# Patient Record
Sex: Female | Born: 1985 | Race: Black or African American | Marital: Single | Smoking: Never smoker
Health system: Northeastern US, Academic
[De-identification: ages and names within clinical notes are randomized; demographics above are authoritative.]

## PROBLEM LIST (undated history)

## (undated) DIAGNOSIS — N760 Acute vaginitis: Secondary | ICD-10-CM

## (undated) DIAGNOSIS — E669 Obesity, unspecified: Secondary | ICD-10-CM

## (undated) DIAGNOSIS — F53 Postpartum depression: Secondary | ICD-10-CM

## (undated) DIAGNOSIS — Z87440 Personal history of urinary (tract) infections: Secondary | ICD-10-CM

## (undated) DIAGNOSIS — B3731 Acute candidiasis of vulva and vagina: Secondary | ICD-10-CM

## (undated) DIAGNOSIS — E282 Polycystic ovarian syndrome: Secondary | ICD-10-CM

## (undated) DIAGNOSIS — I10 Essential (primary) hypertension: Secondary | ICD-10-CM

## (undated) HISTORY — DX: Personal history of urinary (tract) infections: Z87.440

## (undated) HISTORY — DX: Obesity, unspecified: E66.9

## (undated) HISTORY — DX: Acute candidiasis of vulva and vagina: B37.31

## (undated) HISTORY — DX: Acute vaginitis: N76.0

## (undated) HISTORY — DX: Postpartum depression: F53.0

## (undated) HISTORY — PX: MASTECTOMY, PARTIAL: SHX709

## (undated) HISTORY — PX: KNEE SURGERY: SHX244

## (undated) HISTORY — PX: APPENDECTOMY: SHX54

## (undated) HISTORY — PX: CYST EXCISION: SHX5701

---
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
## 2007-03-16 ENCOUNTER — Ambulatory Visit (HOSPITAL_COMMUNITY): Admission: RE | Admit: 2007-03-16 | Discharge: 2007-03-16 | Payer: Self-pay | Admitting: Family Medicine

## 2007-03-18 ENCOUNTER — Encounter: Payer: Self-pay | Admitting: Cardiology

## 2009-04-13 ENCOUNTER — Ambulatory Visit
Admit: 2009-04-13 | Discharge: 2009-04-13 | Disposition: A | Discharge status: Home / Self Care | Payer: Self-pay | Source: Ambulatory Visit | Attending: Obstetrics and Gynecology | Admitting: Obstetrics and Gynecology

## 2009-04-20 ENCOUNTER — Ambulatory Visit
Admit: 2009-04-20 | Discharge: 2009-04-20 | Disposition: A | Discharge status: Home / Self Care | Payer: Self-pay | Source: Ambulatory Visit | Admitting: Obstetrics and Gynecology

## 2009-04-20 ENCOUNTER — Ambulatory Visit
Admit: 2009-04-20 | Discharge: 2009-04-20 | Disposition: A | Discharge status: Home / Self Care | Payer: Self-pay | Source: Ambulatory Visit | Attending: Obstetrics and Gynecology | Admitting: Obstetrics and Gynecology

## 2009-04-27 ENCOUNTER — Ambulatory Visit: Payer: Self-pay | Admitting: Obstetrics and Gynecology

## 2009-05-04 ENCOUNTER — Ambulatory Visit: Payer: Self-pay | Admitting: Obstetrics & Gynecology

## 2009-05-08 ENCOUNTER — Observation Stay
Admit: 2009-05-08 | Disposition: A | Discharge status: Home / Self Care | Payer: Self-pay | Source: Ambulatory Visit | Attending: Family Medicine | Admitting: Family Medicine

## 2009-05-08 ENCOUNTER — Encounter: Payer: Self-pay | Admitting: Family Medicine

## 2009-05-11 ENCOUNTER — Ambulatory Visit: Payer: Self-pay | Admitting: Obstetrics and Gynecology

## 2009-05-12 ENCOUNTER — Ambulatory Visit
Admit: 2009-05-12 | Discharge: 2009-05-12 | Disposition: A | Discharge status: Home / Self Care | Payer: Self-pay | Source: Ambulatory Visit | Admitting: Obstetrics and Gynecology

## 2009-05-12 ENCOUNTER — Observation Stay
Admit: 2009-05-12 | Disposition: A | Discharge status: Home / Self Care | Payer: Self-pay | Source: Ambulatory Visit | Attending: Obstetrics and Gynecology | Admitting: Obstetrics and Gynecology

## 2009-05-12 ENCOUNTER — Inpatient Hospital Stay: Admit: 2009-05-12 | Discharge: 2009-05-12 | Disposition: A | Discharge status: Home / Self Care | Payer: Self-pay

## 2009-05-15 ENCOUNTER — Inpatient Hospital Stay
Admit: 2009-05-15 | Disposition: A | Discharge status: Home / Self Care | Payer: Self-pay | Source: Ambulatory Visit | Attending: Obstetrics and Gynecology | Admitting: Obstetrics and Gynecology

## 2009-05-15 LAB — CBC AND DIFFERENTIAL
Basophil %: 0 % (ref 0.1–1.2)
Eosinophil %: 2 % (ref 0.7–5.8)
Hematocrit: 33 % — ABNORMAL LOW (ref 34–45)
Hemoglobin: 9.9 g/dL — ABNORMAL LOW (ref 11.2–15.7)
Lymphocyte %: 24 % (ref 19.3–51.7)
MCV: 81 fL (ref 79–95)
Monocyte %: 7 % (ref 4.7–12.5)
Platelets: 115 THOU/uL — ABNORMAL LOW (ref 160–370)
RBC: 4 MIL/uL (ref 3.9–5.2)
RDW: 15.2 % — ABNORMAL HIGH (ref 11.7–14.4)
Seg Neut %: 67 % (ref 34.0–71.1)
WBC: 7.2 THOU/uL (ref 4.0–10.0)

## 2009-05-15 LAB — TYPE AND SCREEN
ABO RH Blood Type: B POS
Antibody Screen: NEGATIVE

## 2009-05-17 LAB — RPR: RPR Screen: NONREACTIVE

## 2009-05-18 LAB — COMPREHENSIVE METABOLIC PANEL
ALT: 5 U/L (ref 0–35)
ALT: 6 U/L (ref 0–35)
AST: 20 U/L (ref 0–35)
AST: 25 U/L (ref 0–35)
Albumin: 3.1 g/dL — ABNORMAL LOW (ref 3.5–5.2)
Albumin: 2.9 g/dL — ABNORMAL LOW (ref 3.5–5.2)
Alk Phos: 276 U/L — ABNORMAL HIGH (ref 35–105)
Alk Phos: 246 U/L — ABNORMAL HIGH (ref 35–105)
Anion Gap: 11 (ref 7–16)
Anion Gap: 13 (ref 7–16)
Bilirubin,Total: 0.5 mg/dL (ref 0.0–1.2)
Bilirubin,Total: 0.4 mg/dL (ref 0.0–1.2)
CO2: 22 mmol/L (ref 20–28)
CO2: 20 mmol/L (ref 20–28)
Calcium: 8.4 mg/dL — ABNORMAL LOW (ref 9.0–10.4)
Calcium: 8.7 mg/dL — ABNORMAL LOW (ref 9.0–10.4)
Chloride: 101 mmol/L (ref 96–108)
Chloride: 100 mmol/L (ref 96–108)
Creatinine: 1.24 mg/dL — ABNORMAL HIGH (ref 0.51–0.95)
Creatinine: 1.1 mg/dL — ABNORMAL HIGH (ref 0.51–0.95)
GFR,Black: >59 *
GFR,Black: >59 *
GFR,Caucasian: 53 * — AB
GFR,Caucasian: >59 *
Globulin: 2.4 g/dL — ABNORMAL LOW (ref 2.7–4.3)
Globulin: 2.2 g/dL — ABNORMAL LOW (ref 2.7–4.3)
Glucose: 66 mg/dL — ABNORMAL LOW (ref 74–106)
Glucose: 100 mg/dL (ref 74–106)
Lab: 9 mg/dL (ref 6–20)
Lab: 9 mg/dL (ref 6–20)
Potassium: 4.3 mmol/L (ref 3.3–5.1)
Potassium: 3.8 mmol/L (ref 3.3–5.1)
Sodium: 134 mmol/L (ref 133–145)
Sodium: 133 mmol/L (ref 133–145)
Total Protein: 5.5 g/dL — ABNORMAL LOW (ref 6.3–7.7)
Total Protein: 5.1 g/dL — ABNORMAL LOW (ref 6.3–7.7)

## 2009-05-18 LAB — CBC AND DIFFERENTIAL
Baso # K/uL: 0 THOU/uL (ref 0.0–0.1)
Basophil %: 0.1 % (ref 0.1–1.2)
Eos # K/uL: 0 THOU/uL (ref 0.0–0.4)
Eosinophil %: 0.1 % — ABNORMAL LOW (ref 0.7–5.8)
Hematocrit: 31 % — ABNORMAL LOW (ref 34–45)
Hemoglobin: 9.3 g/dL — ABNORMAL LOW (ref 11.2–15.7)
Lymph # K/uL: 1 THOU/uL — ABNORMAL LOW (ref 1.2–3.7)
Lymphocyte %: 4.6 % — ABNORMAL LOW (ref 19.3–51.7)
MCV: 81 fL (ref 79–95)
Mono # K/uL: 1.2 THOU/uL — ABNORMAL HIGH (ref 0.2–0.9)
Monocyte %: 5.7 % (ref 4.7–12.5)
Neut # K/uL: 19.6 THOU/uL — ABNORMAL HIGH (ref 1.6–6.1)
Platelets: 115 THOU/uL — ABNORMAL LOW (ref 160–370)
RBC: 3.8 MIL/uL — ABNORMAL LOW (ref 3.9–5.2)
RDW: 15.9 % — ABNORMAL HIGH (ref 11.7–14.4)
Seg Neut %: 89.1 % — ABNORMAL HIGH (ref 34.0–71.1)
WBC: 21.9 THOU/uL — ABNORMAL HIGH (ref 4.0–10.0)

## 2009-05-18 LAB — PROTEIN, URINE: Protein,UR: 240 mg/dL

## 2009-05-18 LAB — URIC ACID
Urate: 5.5 mg/dL (ref 2.7–6.8)
Urate: 6.1 mg/dL (ref 2.7–6.8)

## 2009-05-18 LAB — LACTATE DEHYDROGENASE: LD: 225 U/L (ref 118–225)

## 2009-05-18 LAB — CREATININE, URINE: Creatinine,UR: 131 mg/dL

## 2009-05-19 ENCOUNTER — Ambulatory Visit
Admit: 2009-05-19 | Discharge: 2009-05-19 | Disposition: A | Discharge status: Home / Self Care | Payer: Self-pay | Source: Ambulatory Visit | Admitting: Primary Care

## 2009-07-01 ENCOUNTER — Ambulatory Visit: Payer: Self-pay | Admitting: Obstetrics and Gynecology

## 2009-07-27 ENCOUNTER — Ambulatory Visit: Payer: Self-pay | Admitting: Obstetrics and Gynecology

## 2009-07-28 DIAGNOSIS — Z202 Contact with and (suspected) exposure to infections with a predominantly sexual mode of transmission: Secondary | ICD-10-CM

## 2009-07-28 HISTORY — DX: Contact with and (suspected) exposure to infections with a predominantly sexual mode of transmission: Z20.2

## 2009-08-14 ENCOUNTER — Ambulatory Visit: Payer: Self-pay | Admitting: Obstetrics and Gynecology

## 2009-08-14 ENCOUNTER — Ambulatory Visit
Admit: 2009-08-14 | Discharge: 2009-08-14 | Disposition: A | Discharge status: Home / Self Care | Payer: Self-pay | Source: Ambulatory Visit | Attending: Obstetrics and Gynecology | Admitting: Obstetrics and Gynecology

## 2009-08-15 LAB — VAGINITIS SCREEN: DNA PROBE: Vaginitis Screen:DNA Probe: DETECTED

## 2009-08-17 LAB — N. GONORRHOEAE DNA AMPLIFICATION: N. gonorrhoeae DNA Amplification: NOT DETECTED

## 2009-08-17 LAB — CHLAMYDIA PLASMID DNA AMPLIFICATION: Chlamydia Plasmid DNA Amplification: DETECTED

## 2009-09-08 ENCOUNTER — Ambulatory Visit: Payer: Self-pay | Admitting: Obstetrics & Gynecology

## 2009-10-31 ENCOUNTER — Emergency Department
Admit: 2009-10-31 | Disposition: A | Discharge status: Home / Self Care | Payer: Self-pay | Source: Ambulatory Visit | Attending: Emergency Medicine | Admitting: Emergency Medicine

## 2009-11-04 ENCOUNTER — Ambulatory Visit: Payer: Self-pay

## 2010-01-27 ENCOUNTER — Ambulatory Visit: Payer: Self-pay

## 2010-04-13 ENCOUNTER — Emergency Department: Admit: 2010-04-13 | Disposition: A | Discharge status: Home / Self Care | Payer: Self-pay | Source: Ambulatory Visit

## 2010-04-13 LAB — RAPID STREP SCREEN: Rapid Strep Group A Throat: 0

## 2010-04-13 MED ORDER — AZITHROMYCIN 250 MG PO TABS *I*
ORAL_TABLET | ORAL | Status: AC
Start: 2010-04-13 — End: 2010-04-18

## 2010-04-13 MED ORDER — IBUPROFEN 200 MG PO TABS *I*
600.0000 mg | ORAL_TABLET | Freq: Once | ORAL | Status: AC
Start: 2010-04-13 — End: 2010-04-13
  Administered 2010-04-13: 600 mg via ORAL
  Filled 2010-04-13: qty 3

## 2010-04-13 NOTE — ED Notes (Signed)
 Pt c/o 3 days throat pain and eye redness with discharge since this morning.  Throat swab obtained in triage

## 2010-04-13 NOTE — Discharge Instructions (Signed)
Use warm compresses to left eye four times a day for viral conjunctivitis.     Take antibiotics in 1-2 days with increased symptoms.   Most likely this is viral. We will call you if your culture is positive for strep.     Drink plenty of fluids and rest.    Take Ibuprofen as needed and directed for pain. Take with food.

## 2010-04-14 ENCOUNTER — Ambulatory Visit: Payer: Self-pay

## 2010-04-14 ENCOUNTER — Encounter: Payer: Self-pay | Admitting: Geriatric Medicine

## 2010-04-14 NOTE — ED Provider Notes (Signed)
 History   Chief Complaint   Patient presents with   . Pharyngitis       HPI Comments: Patient comes in with left red eye, and sore throat. The sore throat started two days ago and is getting worse. She has a history of strep pharyngitis and is worried she has it again. The sore throat is also associated with post nasal drip. No visual changes in her eye. No pain, mild clear drainage. No itching from the eye.     The history is provided by the patient.       History reviewed.  No pertinent past medical history.    History reviewed.  No pertinent past surgical history.    History reviewed.  No pertinent family history.     reports that she has never smoked. She does not have any smokeless tobacco history on file.  She reports that she does not currently drink alcohol or use illicit drugs.    Review of Systems   Review of Systems   Constitutional: Negative for fever, chills, diaphoresis, activity change, appetite change, fatigue and unexpected weight change.   HENT: Positive for congestion, sore throat and postnasal drip. Negative for ear pain, nosebleeds, facial swelling, rhinorrhea, sneezing, drooling, mouth sores, trouble swallowing, dental problem, voice change, sinus pressure, tinnitus and ear discharge.    Eyes: Positive for discharge and redness. Negative for photophobia, pain, itching and visual disturbance.   Respiratory: Negative.    Cardiovascular: Negative.    Gastrointestinal: Negative.    Genitourinary: Negative.    Musculoskeletal: Negative.    Skin: Negative.    Neurological: Negative.    Hematological: Negative.    Psychiatric/Behavioral: Negative.        Physical Exam   BP 128/66  Pulse 84  Temp 36 C (96.8 F)  Resp 20  Wt 68.04 kg (150 lb)  SpO2 99%    Physical Exam   Constitutional: She is oriented to person, place, and time. She appears well-developed and well-nourished. No distress.   HENT:   Head: Normocephalic and atraumatic.   Right Ear: External ear normal.   Left Ear: External ear  normal.   Nose: Nose normal.   Mouth/Throat: Uvula is midline and mucous membranes are normal. Posterior oropharyngeal erythema present. No oropharyngeal exudate, posterior oropharyngeal edema or tonsillar abscesses.   Eyes: EOM are normal. Pupils are equal, round, and reactive to light. Lids are everted and swept, no foreign bodies found. Right conjunctiva is not injected. Right conjunctiva has no hemorrhage. Left conjunctiva is injected. Left conjunctiva has no hemorrhage.   Neck: Normal range of motion.   Cardiovascular: Normal rate.    Pulmonary/Chest: Effort normal.   Musculoskeletal: Normal range of motion.   Neurological: She is alert and oriented to person, place, and time.   Skin: Skin is warm and dry. No rash noted. She is not diaphoretic. No erythema. No pallor.   Psychiatric: She has a normal mood and affect. Her behavior is normal. Judgment and thought content normal.       Medical Decision Making   MDM  Number of Diagnoses or Management Options  Acute pharyngitis: new, needed workup  Viral conjunctivitis: new, needed workup  Diagnosis management comments: Patient seen by me on arrival date of 04/13/2010 at the time of arrival 9:05 AM    Assessment:  24 y.o., female comes to the ED with pink eye, sore throat.   Differential Diagnosis includes strep pharyngitis, viral conjunctivitis.    Plan: Warm soaks to left  eye four times a day.   Rx given for antibiotics but counseled patient to wait to take if symptoms worsen or culture comes back positive.   Strep rapid and culture collected, rapid negative.           Duncan Dull, NP

## 2010-04-15 LAB — STREP A CULTURE, THROAT: Group A Strep Throat Culture: 0

## 2010-04-30 ENCOUNTER — Ambulatory Visit: Payer: Self-pay | Admitting: Obstetrics and Gynecology

## 2010-04-30 ENCOUNTER — Ambulatory Visit
Admit: 2010-04-30 | Discharge: 2010-04-30 | Disposition: A | Discharge status: Home / Self Care | Payer: Self-pay | Source: Ambulatory Visit | Attending: Obstetrics and Gynecology | Admitting: Obstetrics and Gynecology

## 2010-05-02 LAB — AEROBIC CULTURE

## 2010-05-03 LAB — CHLAMYDIA PLASMID DNA AMPLIFICATION: Chlamydia Plasmid DNA Amplification: 0

## 2010-05-03 LAB — N. GONORRHOEAE DNA AMPLIFICATION: N. gonorrhoeae DNA Amplification: 0

## 2010-06-15 ENCOUNTER — Ambulatory Visit
Admit: 2010-06-15 | Discharge: 2010-06-15 | Disposition: A | Discharge status: Home / Self Care | Payer: Self-pay | Source: Ambulatory Visit | Attending: Family | Admitting: Family

## 2010-06-15 LAB — CBC AND DIFFERENTIAL
Baso # K/uL: 0 THOU/uL (ref 0.0–0.1)
Basophil %: 0.2 % (ref 0.1–1.2)
Eos # K/uL: 0.1 THOU/uL (ref 0.0–0.4)
Eosinophil %: 0.7 % (ref 0.7–5.8)
Hematocrit: 43 % (ref 34–45)
Hemoglobin: 13.3 g/dL (ref 11.2–15.7)
Lymph # K/uL: 1.1 THOU/uL — ABNORMAL LOW (ref 1.2–3.7)
Lymphocyte %: 11 % — ABNORMAL LOW (ref 19.3–51.7)
MCV: 88 fL (ref 79–95)
Mono # K/uL: 0.6 THOU/uL (ref 0.2–0.9)
Monocyte %: 5.7 % (ref 4.7–12.5)
Neut # K/uL: 7.9 THOU/uL — ABNORMAL HIGH (ref 1.6–6.1)
Platelets: 179 THOU/uL (ref 160–370)
RBC: 4.9 MIL/uL (ref 3.9–5.2)
RDW: 13.7 % (ref 11.7–14.4)
Seg Neut %: 82.4 % — ABNORMAL HIGH (ref 34.0–71.1)
WBC: 9.6 THOU/uL (ref 4.0–10.0)

## 2010-06-16 LAB — COMPREHENSIVE METABOLIC PANEL
ALT: 25 U/L (ref 0–35)
AST: 30 U/L (ref 0–35)
Albumin: 4.4 g/dL (ref 3.5–5.2)
Alk Phos: 92 U/L (ref 35–105)
Anion Gap: 12 (ref 7–16)
Bilirubin,Total: 0.5 mg/dL (ref 0.0–1.2)
CO2: 22 mmol/L (ref 20–28)
Calcium: 9 mg/dL (ref 9.0–10.4)
Chloride: 104 mmol/L (ref 96–108)
Creatinine: 0.78 mg/dL (ref 0.51–0.95)
GFR,Black: >59 *
GFR,Caucasian: >59 *
Glucose: 96 mg/dL (ref 74–106)
Lab: 11 mg/dL (ref 6–20)
Potassium: 4.4 mmol/L (ref 3.3–5.1)
Sodium: 138 mmol/L (ref 133–145)
Total Protein: 7.2 g/dL (ref 6.3–7.7)

## 2010-06-17 LAB — VITAMIN D
25-OH VIT D2: <4 ng/mL
25-OH VIT D3: 11 ng/mL
25-OH Vit Total: 11 ng/mL — ABNORMAL LOW (ref 30–80)

## 2010-06-30 ENCOUNTER — Ambulatory Visit: Payer: Self-pay | Admitting: Obstetrics & Gynecology

## 2010-09-15 ENCOUNTER — Ambulatory Visit
Admit: 2010-09-15 | Discharge: 2010-09-15 | Disposition: A | Discharge status: Home / Self Care | Payer: Self-pay | Source: Ambulatory Visit | Admitting: Obstetrics and Gynecology

## 2010-09-15 ENCOUNTER — Ambulatory Visit: Payer: Self-pay | Admitting: Obstetrics and Gynecology

## 2010-09-15 ENCOUNTER — Ambulatory Visit
Admit: 2010-09-15 | Discharge: 2010-09-15 | Disposition: A | Discharge status: Home / Self Care | Payer: Self-pay | Source: Ambulatory Visit | Attending: Obstetrics and Gynecology | Admitting: Obstetrics and Gynecology

## 2010-09-16 LAB — CHLAMYDIA PLASMID DNA AMPLIFICATION: Chlamydia Plasmid DNA Amplification: 0

## 2010-09-16 LAB — N. GONORRHOEAE DNA AMPLIFICATION: N. gonorrhoeae DNA Amplification: 0

## 2010-09-16 LAB — VAGINITIS SCREEN: DNA PROBE: Vaginitis Screen:DNA Probe: POSITIVE

## 2010-09-21 LAB — GYN CYTOLOGY

## 2010-11-12 NOTE — Op Note (Signed)
Patricia Fowler, Patricia Fowler               ACCOUNT NO.:  [id number]   MEDICAL RECORD NO.:  [id number]          PATIENT TYPE:  AMB   LOCATION:  DAY                           FACILITY:  [facility]   PHYSICIAN:  Barbaraann Barthel, M.D. DATE OF BIRTH:  10/04/1985   DATE OF PROCEDURE:  11/24/2004  DATE OF DISCHARGE:                                 OPERATIVE REPORT   SURGEON:  Dr. Malvin Johns.   PREOPERATIVE DIAGNOSIS:  Abnormal sonogram, left breast, with mass in left  breast.   PROCEDURE:  Left partial mastectomy.   NOTE:  This is a 25 year old white female on breast control pills had a  palpable mass in approximately the 2 o'clock position of her left breast. On  serial examinations, this had increased in size. 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 We discussed surgery with this patient,  and as this is likely to increase in size at this age and had in fact  increased on clinical examination, I suggested excisional biopsy. This is  what the family opted for rather than a six months' follow-up with sonogram.   GROSS OPERATIVE FINDINGS:  There were two masses in the left breast in the 2  o'clock position, clinically suggestive of fibroadenoma. Final pathology is  pending.   SPECIMEN:  Left breast tissue.   TECHNIQUE:  The patient was placed in the supine position. After the  adequate administration of general anesthesia with LMA, the patient's left  hemithorax was prepped with Betadine solution and draped in the usual  manner. An area that had been previously identified on the left breast was  marked with a sterile marking pen. We then did peri-areolar incision around  the lateral aspect of the left breast, and we were able to remove both of  the palpable masses in this area. These were sent for informal and for final  pathological rendering. The breast tissue was cauterized with a cautery  device. Bleeding was easily controlled as such. We used approximately 8 cc  of 1/2%  Sensorcaine to help with postoperative discomfort. Breast tissue was  then approximated with 3-0 Polysorb suture in the nipple areolar complex.  Complex was then repaired cosmetically with a 5-0 subcuticular suture. Steri-  Strips and  Neosporin were applied. Prior to closure, all sponge, needle, and instrument  counts were found to be correct. Estimated blood loss was minimal. No drains  were placed. The patient received approximately 800 cc of crystalloids  intraoperatively. There no complications.       WB/MEDQ  D:  11/24/2004  T:  11/24/2004  Job:  [id number]   cc:   Langley Gauss, MD  [address]  [place]  [place] [address]  Fax: [phone]   Manson Passey, M.D.  Radiology department

## 2010-12-30 ENCOUNTER — Ambulatory Visit: Payer: Self-pay

## 2010-12-30 ENCOUNTER — Ambulatory Visit
Admit: 2010-12-30 | Discharge: 2010-12-30 | Disposition: A | Discharge status: Home / Self Care | Payer: Self-pay | Source: Ambulatory Visit

## 2010-12-30 VITALS — BP 102/52 | Ht 62.0 in | Wt 170.0 lb

## 2010-12-30 DIAGNOSIS — E669 Obesity, unspecified: Secondary | ICD-10-CM

## 2010-12-30 DIAGNOSIS — Z3042 Encounter for surveillance of injectable contraceptive: Secondary | ICD-10-CM

## 2010-12-30 DIAGNOSIS — Z800 Family history of malignant neoplasm of digestive organs: Secondary | ICD-10-CM | POA: Insufficient documentation

## 2010-12-30 MED ORDER — MEDROXYPROGESTERONE ACETATE 150 MG/ML IM SUSP *I*
150.0000 mg | INTRAMUSCULAR | Status: DC
Start: 2010-12-30 — End: 2013-07-29

## 2010-12-30 MED ORDER — MEDROXYPROGESTERONE ACETATE 150 MG/ML IM SUSP *I*
150.0000 mg | INTRAMUSCULAR | Status: AC
Start: 2010-12-30 — End: 2010-12-30
  Administered 2010-12-30: 150 mg via INTRAMUSCULAR

## 2010-12-30 MED ORDER — MEDROXYPROGESTERONE ACETATE 150 MG/ML IM SUSP *I*
150.0000 mg | INTRAMUSCULAR | Status: DC
Start: 2010-12-30 — End: 2010-12-30

## 2010-12-30 NOTE — Patient Instructions (Signed)
Injectable Contraception     Depot medroxyprogesterone acetate (DMPA) is an injectable drug that prevents pregnancy for 3 months (13 weeks) at a time. This birth control method is often called “the shot.” DMPA is a good choice for a woman who wants safe, reliable, reversible contraception.    How does DMPA work?  DMPA is a female hormone (progestin) that stops an egg from being  released by the ovary. The drug is injected into the arm or buttock  muscle, where it is absorbed slowly. The contraceptive effects last for up  to 3 months after the shot.    How effective is this birth control method?  DMPA is highly effective. For every 100 women using DMPA, less than  1 per year will get pregnant. For DMPA to work its best, women must  get regular injections.    Does DMPA cause cancer?  The World Health Organization has found that there is no higher risk  of breast cancer or other cancers associated with DMPA. In fact, DMPA  lowers a woman's risk of developing cancer of the lining of the womb  (endometrial cancer).    What are the side effects?  The most common side effects are menstrual changes. Although these  changes are not always the same for every woman, they occur in almost  all users of injectable contraception. Irregular bleeding and spotting are  typical during the first few months. Women will likely need to use extra sanitary napkins or panty liners. The bleeding and spotting usually lessen over time.  After 1 year of use, at least 50% of women have no bleeding while they continue to get injections. Not bleeding is medically safe, and many women are happy not to have regular periods. DMPA does not “turn off” periods like turning off a faucet. It changes bleeding patterns by thinning the lining of the uterus.  Weight gain is another common side effect that tends to continue with ongoing use. At the end of 1 year, the average weight gain is about 51/2 pounds; at the end of 2 years it is about 8 pounds. Some  women may gain even more weight. Other possible side effects that have been reported include headache, breast tenderness, loss of sex drive, depression, nervousness, and tiredness.      Will DMPA affect my bones?  Women who use DMPA may experience a bone loss. Some of the bone that was lost may be regained when they stop using the injection for birth control.  In studies with teenagers, a 2-3% loss in bone was reported after one year. Although this bone loss may seem small, it is happening at a time when bone should be building up. And, like adult women, teens do not get all of the bone back that they lost while using the injection. Scientists do not know if there are any harmful effects on bone that would be seen later in a woman’s life.  For that reason, your clinician may not recommend DMPA as a long-term birth control method (for example longer than two years) unless no other method is adequate for you. No matter what birth control method is chosen, every woman should make sure she gets enough calcium and vitamin D in her diet and through vitamin tablets. Speak with your health care provider about your bone health, proper diet, exercise, and vitamins.    Will DMPA hurt my chances of getting pregnant in the future?  DMPA does not have any permanent effects on a   woman’s ability to get pregnant. However, it may take longer for a woman to get pregnant after she stops using DMPA than if other methods were used. For example, one study found that in women who stopped using DMPA and wished to become pregnant, 68% did so within 12 months, 83% within 15 months, and 93% within 18 months.  Whether a delay in getting pregnant occurs depends on many factors, including a woman’s health, age, and ability to get pregnant before she used DMPA.    How often do I need to get DMPA?  This type of birth control should be given every 3 months (13 weeks). You should schedule and keep your appointment for your shot before you leave the  clinic. If you wait more than 14 weeks to get your next shot, you will have to use another birth control method to protect yourself from getting pregnant. And, your clinician may need to do a pregnancy test before you get your next shot.    Reviewed 03/2010  Depo-Provera Contraceptive  Important Information For You To Know    Weight gain: Depo injections may increase your appetite. You can help control weight gain by carefully selecting your foods.    Choose:    fruits and vegetables and whole grains (Whole grain bread, Whole grain pasta,  high fiber cereals,  brown or wild rice, lean meats and eggs)  Avoid:  sugary or high fat foods (soda or punch, candy, cookies, chips , French fries, fast food)    Bone health: Depo may cause calcium to be lost from your bones.  Do:  eat foods high in calcium (3-4 servings /day) Low fat milk, cheese, yogurt, cream soups with milk, calcium fortified orange juice  Do:  take a calcium supplement if prescribed by your provider    Exercise, exercise, exercise! Exercise helps you control your weight and keeps your bones strong.  Find an activity you enjoy:  fast walking or jogging, bicycling, aerobics or gym machines, weight lifting, dancing. Try for 30-60 minutes, 4-5 times /week.

## 2010-12-30 NOTE — Progress Notes (Signed)
Subjective:      Linda Oneal is a 25 y.o. [phone] female who presents for contraception counseling. The patient has no complaints today. The patient is sexually active. Last sexual IC was 2 weeks ago.  Currently using condoms: 100% of the time. Pertinent past medical history: none.    Review of Systems  A comprehensive review of systems was negative.     Objective:      No exam performed today, declined.     Assessment:      25 y.o., restarting Depo-Provera injections, no contraindications.     Plan:      Health Risks:   Obese, BMI 31, family Hx DM, agrees to TSH, HgbA1C, 2 hour GTT, ordered  Sister diagnosed with Colon Cancer ? 97 yo, unknown if tested for BRCA, patient to discuss with sister.  Pt was offered colonoscopy at her AGYN exam 09/15/10 & Declined it.  BRA, SE, & warnings reviewed with Depo.  Included were weight gain, depression, and increased bone loss with use greater than 2 years.    Instructions given for Depo, Obesity, extensive review of nutrition.  RTC 11-13 w for Depo  RTC for AGYN with Dr. Costella Hatcher.  Magda Paganini, CNM

## 2010-12-30 NOTE — Progress Notes (Signed)
URINE PREGNANCY TEST: NEGATIVE     TEST DONE IN :  3    MINUTES    TEST HAVE CONTROL LINE: YES    DATE: 12/30/10  Allen Kell

## 2010-12-30 NOTE — Progress Notes (Signed)
Linda Oneal presented today for Depo    Last Depo administered: 09/15/10    VSS:     Wt: 170 lb (77.111 kg)    BP: 102/52    Allergies:   Allergies as of 12/30/2010   . (No Known Allergies)       Lot # [id number]  Exp date 05/2013  Condom use for STD protection reinforced.  Calcium and Vitamin D supplementation reinforced.  Depo Provera administered, Rt deltoid, patient tolerated well.  Pt  to return in 11-13 weeks for next Depo and annual exam.

## 2011-03-17 ENCOUNTER — Encounter: Payer: Self-pay | Admitting: Obstetrics and Gynecology

## 2011-03-17 DIAGNOSIS — F32A Depression, unspecified: Secondary | ICD-10-CM | POA: Insufficient documentation

## 2011-03-17 DIAGNOSIS — Z3042 Encounter for surveillance of injectable contraceptive: Secondary | ICD-10-CM | POA: Insufficient documentation

## 2011-03-18 ENCOUNTER — Ambulatory Visit: Payer: Self-pay | Admitting: Obstetrics and Gynecology

## 2012-07-03 ENCOUNTER — Emergency Department (HOSPITAL_COMMUNITY)
Admission: EM | Admit: 2012-07-03 | Discharge: 2012-07-03 | Disposition: A | Discharge status: Home / Self Care | Payer: Self-pay | Attending: Emergency Medicine | Admitting: Emergency Medicine

## 2012-07-03 ENCOUNTER — Emergency Department (HOSPITAL_COMMUNITY): Payer: Self-pay

## 2012-07-03 ENCOUNTER — Encounter (HOSPITAL_COMMUNITY): Payer: Self-pay | Admitting: *Deleted

## 2012-07-03 DIAGNOSIS — O039 Complete or unspecified spontaneous abortion without complication: Secondary | ICD-10-CM | POA: Insufficient documentation

## 2012-07-03 DIAGNOSIS — Z3201 Encounter for pregnancy test, result positive: Secondary | ICD-10-CM | POA: Insufficient documentation

## 2012-07-03 LAB — PREGNANCY, URINE: Preg Test, Ur: POSITIVE — AB

## 2012-07-03 LAB — ABO/RH: ABO/RH(D): B POS

## 2012-07-03 NOTE — ED Provider Notes (Signed)
History     CSN: [id number]  Arrival date & time 07/24/2012  0401   First MD Initiated Contact with Patient July 24, 2012 [phone]      Chief Complaint  Patient presents with  . Vaginal Bleeding    (Consider location/radiation/quality/duration/timing/severity/associated sxs/prior treatment) HPI This is a 27 year old female who states she had her normal menstrual period about a week ago. She states she's been having vaginal bleeding since about noon yesterday. It has been heavier than a period and included passage of clots but no tissue. She has had some vague pelvic discomfort but no frank pain. She has not been using any contraception. There are no exacerbating or mitigating factors.  History reviewed. No pertinent past medical history.  History reviewed. No pertinent past surgical history.  No family history on file.  History  Substance Use Topics  . Smoking status: Never Smoker   . Smokeless tobacco: Not on file  . Alcohol Use: No    OB History    Grav Para Term Preterm Abortions TAB SAB Ect Mult Living                  Review of Systems  All other systems reviewed and are negative.    Allergies  Review of patient's allergies indicates no known allergies.  Home Medications  No current outpatient prescriptions on file.  BP 143/92  Pulse 94  Temp 98.1 F (36.7 C) (Oral)  Resp 16  Ht 5\' 2"  (1.575 m)  Wt 160 lb (72.576 kg)  BMI 29.26 kg/m2  SpO2 100%  LMP 06/23/2012  Physical Exam General: Well-developed, well-nourished female in no acute distress; appearance consistent with age of record HENT: normocephalic, atraumatic Eyes: pupils equal round and reactive to light; extraocular muscles intact Neck: supple Heart: regular rate and rhythm Lungs: clear to auscultation bilaterally Abdomen: soft; nondistended; nontender; no masses or hepatosplenomegaly; bowel sounds present GU:  Extremities: No deformity; full range of motion Neurologic: Awake, alert and oriented;  motor function intact in all extremities and symmetric; no facial droop Skin: Warm and dry Psychiatric: Normal mood and affect     ED Course  Procedures (including critical care time)     MDM   Nursing notes and vitals signs, including pulse oximetry, reviewed.  Summary of this visit's results, reviewed by myself:  Labs:  Results for orders placed during the hospital encounter of 07/24/2012 (from the past 24 hour(s))  PREGNANCY, URINE     Status: Abnormal   Collection Time   24-Jul-2012  4:23 AM      Component Value Range   Preg Test, Ur POSITIVE (*) NEGATIVE  HCG, QUANTITATIVE, PREGNANCY     Status: Abnormal   Collection Time   2012-07-24  5:00 AM      Component Value Range   hCG, Beta Chain, Quant, S 5468 (*) <5 mIU/mL  ABO/RH     Status: Normal   Collection Time   2012-07-24  5:00 AM      Component Value Range   ABO/RH(D) B POS     No rh immune globuloin NOT A RH IMMUNE GLOBULIN CANDIDATE, PT RH POSITIVE      Imaging Studies: US Ob Comp Less 14 Wks  July 24, 2012  *RADIOLOGY REPORT*  Clinical Data: Pregnant patient with vaginal bleeding and cramping. Quantitative HCG 5468.  OBSTETRIC <14 WK [place] AND TRANSVAGINAL OB US  Technique:  Both transabdominal and transvaginal ultrasound examinations were performed for complete evaluation of the gestation as well as the maternal uterus, adnexal  regions, and pelvic cul-de-sac.  Transvaginal technique was performed to assess early pregnancy.  Comparison:  None.  Intrauterine gestational sac:  Not visualized. The lower uterine segment appears heterogeneous and appears to contain some debris. Yolk sac: Not visualized. Embryo: Not visualized. Cardiac Activity: Not applicable.  Maternal uterus/adnexae: Ovaries appear normal.  No adnexal mass is identified.  IMPRESSION: Small amount of debris in the lower uterine segment may be due to abortion in progress but cannot be definitively characterized.  No evidence of ectopic pregnancy is present. Findings are  suspicious but not yet definitive for failed pregnancy.  Recommend follow-up US in 10-14 days for definitive diagnosis. Recommend correlation with serial quantitative HCG.  This recommendation follows SRU consensus guidelines: Diagnostic Criteria for Nonviable Pregnancy Early in the First Trimester.  Malva Limes Med 2013; 914:7829-56.   Original Report Authenticated By: Holley Dexter, M.D.    US Ob Transvaginal  07/03/2012  *RADIOLOGY REPORT*  Clinical Data: Pregnant patient with vaginal bleeding and cramping. Quantitative HCG 5468.  OBSTETRIC <14 WK [place] AND TRANSVAGINAL OB US  Technique:  Both transabdominal and transvaginal ultrasound examinations were performed for complete evaluation of the gestation as well as the maternal uterus, adnexal regions, and pelvic cul-de-sac.  Transvaginal technique was performed to assess early pregnancy.  Comparison:  None.  Intrauterine gestational sac:  Not visualized. The lower uterine segment appears heterogeneous and appears to contain some debris. Yolk sac: Not visualized. Embryo: Not visualized. Cardiac Activity: Not applicable.  Maternal uterus/adnexae: Ovaries appear normal.  No adnexal mass is identified.  IMPRESSION: Small amount of debris in the lower uterine segment may be due to abortion in progress but cannot be definitively characterized.  No evidence of ectopic pregnancy is present. Findings are suspicious but not yet definitive for failed pregnancy.  Recommend follow-up US in 10-14 days for definitive diagnosis. Recommend correlation with serial quantitative HCG.  This recommendation follows SRU consensus guidelines: Diagnostic Criteria for Nonviable Pregnancy Early in the First Trimester.  Malva Limes Med 2013; 213:0865-78.   Original Report Authenticated By: Holley Dexter, M.D.    6:14 AM Patient advised of ultrasound and lab findings. We'll have her followup at [facility] in about 2 weeks.           Hanley Seamen, MD 07/03/12 [phone]

## 2012-07-03 NOTE — ED Notes (Signed)
Pt c/o vaginal bleeding; heavy with clots starting Monday; finished normal period on Thursday; abd cramping

## 2012-07-09 ENCOUNTER — Inpatient Hospital Stay (HOSPITAL_COMMUNITY)
Admission: AD | Admit: 2012-07-09 | Discharge: 2012-07-09 | Disposition: A | Discharge status: Home / Self Care | Payer: Self-pay | Attending: Obstetrics & Gynecology | Admitting: Obstetrics & Gynecology

## 2012-07-09 ENCOUNTER — Encounter (HOSPITAL_COMMUNITY): Payer: Self-pay | Admitting: *Deleted

## 2012-07-09 ENCOUNTER — Emergency Department (HOSPITAL_COMMUNITY): Payer: Self-pay

## 2012-07-09 DIAGNOSIS — O00109 Unspecified tubal pregnancy without intrauterine pregnancy: Secondary | ICD-10-CM | POA: Insufficient documentation

## 2012-07-09 DIAGNOSIS — O009 Unspecified ectopic pregnancy without intrauterine pregnancy: Secondary | ICD-10-CM

## 2012-07-09 DIAGNOSIS — N939 Abnormal uterine and vaginal bleeding, unspecified: Secondary | ICD-10-CM

## 2012-07-09 DIAGNOSIS — R1031 Right lower quadrant pain: Secondary | ICD-10-CM | POA: Insufficient documentation

## 2012-07-09 DIAGNOSIS — E349 Endocrine disorder, unspecified: Secondary | ICD-10-CM

## 2012-07-09 LAB — BASIC METABOLIC PANEL
BUN: 12 mg/dL (ref 6–23)
Calcium: 9.1 mg/dL (ref 8.4–10.5)
Creatinine, Ser: 0.77 mg/dL (ref 0.50–1.10)
GFR calc Af Amer: >90 mL/min (ref >90)
GFR calc non Af Amer: >90 mL/min (ref >90)

## 2012-07-09 LAB — CBC WITH DIFFERENTIAL/PLATELET
Basophils Absolute: 0 10*3/uL (ref 0.0–0.1)
Basophils Relative: 0 % (ref 0–1)
Eosinophils Absolute: 0.1 10*3/uL (ref 0.0–0.7)
HCT: 35.2 % — ABNORMAL LOW (ref 36.0–46.0)
Hemoglobin: 11.4 g/dL — ABNORMAL LOW (ref 12.0–15.0)
MCH: 27.1 pg (ref 26.0–34.0)
MCHC: 32.4 g/dL (ref 30.0–36.0)
Monocytes Absolute: 0.7 10*3/uL (ref 0.1–1.0)
Monocytes Relative: 9 % (ref 3–12)
RDW: 13.4 % (ref 11.5–15.5)

## 2012-07-09 LAB — HEPATIC FUNCTION PANEL
Bilirubin, Direct: <0.1 mg/dL (ref 0.0–0.3)
Total Bilirubin: 0.2 mg/dL — ABNORMAL LOW (ref 0.3–1.2)

## 2012-07-09 MED ORDER — SODIUM CHLORIDE 0.9 % IV BOLUS (SEPSIS): 1000.0000 mL | Freq: Once | INTRAVENOUS | Status: DC

## 2012-07-09 MED ORDER — METHOTREXATE INJECTION FOR WOMEN'S HOSPITAL
50.0000 mg/m2 | Freq: Once | INTRAMUSCULAR | Status: AC
  Administered 2012-07-09: 90 mg via INTRAMUSCULAR
  Filled 2012-07-09: qty 1.8

## 2012-07-09 NOTE — ED Notes (Signed)
Pt states had a miscarriage last wk; had stopped bleeding and started passing large clots again tonight; mild cramping

## 2012-07-09 NOTE — MAU Note (Signed)
Patient is brought from [place] by [facility] for further evaluation. She states that she went into the ED at 0100am due to intense llq pain that radiates to her lower back. She states that the pain have subsided rates it a 3/10 and denies vaginal bleeding, or lightheadness.  She states that she was told on 07/03/12 that she was having a miscarriage and she reports vaginal bleeding until last Friday. She states that yesterday she started having some bleeding and pain.

## 2012-07-09 NOTE — ED Notes (Signed)
Pt in ultrasound

## 2012-07-09 NOTE — ED Provider Notes (Signed)
Medical screening examination/treatment/procedure(s) were conducted as a shared visit with non-physician practitioner(s) and myself.  I personally evaluated the patient during the encounter  8:07 AM Abdomen soft, minimally tender in the right suprapubic region. Patient advised of ultrasound and lab findings and suspicion for ectopic pregnancy. We will transfer her to [facility] hospital for further evaluation and treatment.  Hanley Seamen, MD 07/09/12 [id number]

## 2012-07-09 NOTE — ED Provider Notes (Signed)
History     CSN: [id number]  Arrival date & time 07/09/12  0101   First MD Initiated Contact with Patient 07/09/12 [phone]      Chief Complaint  Patient presents with  . Vaginal Bleeding    (Consider location/radiation/quality/duration/timing/severity/associated sxs/prior treatment) HPI History provided by pt and prior chart.  Per prior chart, pt seen in ED for vaginal bleeding one week ago.  Pt was unaware that she was pregnant.  Transvaginal US showed possible spontaneous abortion.  Pt was advised to f/u with GYN and go to [facility] for worsening sx.  She returns today because though her bleeding had been gradually improving, yesterday evening, she developed severe lower abdominal cramping and began to bleed heavily again.  Had to change her pad approx qhr.  Associated w/ low back pain and orthostatic lightheadedness.  Denies N/V/D and other GU sx.   History reviewed. No pertinent past medical history.  History reviewed. No pertinent past surgical history.  No family history on file.  History  Substance Use Topics  . Smoking status: Never Smoker   . Smokeless tobacco: Not on file  . Alcohol Use: No    OB History    Grav Para Term Preterm Abortions TAB SAB Ect Mult Living                  Review of Systems  All other systems reviewed and are negative.    Allergies  Review of patient's allergies indicates no known allergies.  Home Medications  No current outpatient prescriptions on file.  BP 107/55  Pulse 83  Temp 98.5 F (36.9 C) (Oral)  Resp 20  SpO2 99%  LMP 06/23/2012  Physical Exam  Nursing note and vitals reviewed. Constitutional: She is oriented to person, place, and time. She appears well-developed and well-nourished. No distress.  HENT:  Head: Normocephalic and atraumatic.  Eyes:       Normal appearance  Neck: Normal range of motion.  Cardiovascular: Normal rate and regular rhythm.   Pulmonary/Chest: Effort normal and breath sounds normal.  No respiratory distress.  Abdominal: Soft. Bowel sounds are normal. She exhibits no distension and no mass. There is no tenderness. There is no rebound and no guarding.  Genitourinary:       No CVA tenderness.  Nml external genitalia.  Pt passing large blood clots.  Cervix closed and appears nml.  No cervical motion tenderness but bilateral adnexal ttp.   Musculoskeletal: Normal range of motion.  Neurological: She is alert and oriented to person, place, and time.  Skin: Skin is warm and dry. No rash noted.  Psychiatric: She has a normal mood and affect. Her behavior is normal.    ED Course  Procedures (including critical care time)   Labs Reviewed  CBC WITH DIFFERENTIAL  BASIC METABOLIC PANEL  HCG, QUANTITATIVE, PREGNANCY   US Ob Comp Less 14 Wks  07/09/2012  **ADDENDUM** CREATED: 07/09/2012 07:38:40  Discussion with Dr. Read Drivers reveals interval doubling quantitative beta HCG from the previous ultrasound.  Ectopic pregnancy cannot be excluded.  **END ADDENDUM** SIGNED BY: Reyes Ivan, M.D.   07/09/2012  *RADIOLOGY REPORT*  Clinical Data: Vaginal bleeding and pain.  OBSTETRIC <14 WK [place] AND TRANSVAGINAL OB US  Technique:  Both transabdominal and transvaginal ultrasound examinations were performed for complete evaluation of the gestation as well as the maternal uterus, adnexal regions, and pelvic cul-de-sac.  Transvaginal technique was performed to assess early pregnancy.  Comparison:  07/03/2012.  Intrauterine gestational sac:  Absent. Yolk sac: Absent. Embryo: Absent. Cardiac Activity: Absent.  Maternal uterus/adnexae: No subchorionic hemorrhage.  Ovaries are visualized.  Hypoechoic thickening is seen in the endometrium with debris in the lower uterine segment.  An anechoic structure with increased through transmission is seen in the left adnexa, measuring 1.6 x 0.7 x 1.1 cm.  Small free fluid.  IMPRESSION:  1.  No viable intrauterine pregnancy. 2.  Hypoechoic debris and thickening within the  endometrial canal and lower uterine segment. 3.  Focal anechoic structure in the left ovary is new and may represent focal free fluid. 4.  Small free fluid.   Original Report Authenticated By: Leanna Battles, M.D.    US Ob Transvaginal  07/09/2012  **ADDENDUM** CREATED: 07/09/2012 07:38:40  Discussion with Dr. Read Drivers reveals interval doubling quantitative beta HCG from the previous ultrasound.  Ectopic pregnancy cannot be excluded.  **END ADDENDUM** SIGNED BY: Reyes Ivan, M.D.   07/09/2012  *RADIOLOGY REPORT*  Clinical Data: Vaginal bleeding and pain.  OBSTETRIC <14 WK [place] AND TRANSVAGINAL OB US  Technique:  Both transabdominal and transvaginal ultrasound examinations were performed for complete evaluation of the gestation as well as the maternal uterus, adnexal regions, and pelvic cul-de-sac.  Transvaginal technique was performed to assess early pregnancy.  Comparison:  07/03/2012.  Intrauterine gestational sac:  Absent. Yolk sac: Absent. Embryo: Absent. Cardiac Activity: Absent.  Maternal uterus/adnexae: No subchorionic hemorrhage.  Ovaries are visualized.  Hypoechoic thickening is seen in the endometrium with debris in the lower uterine segment.  An anechoic structure with increased through transmission is seen in the left adnexa, measuring 1.6 x 0.7 x 1.1 cm.  Small free fluid.  IMPRESSION:  1.  No viable intrauterine pregnancy. 2.  Hypoechoic debris and thickening within the endometrial canal and lower uterine segment. 3.  Focal anechoic structure in the left ovary is new and may represent focal free fluid. 4.  Small free fluid.   Original Report Authenticated By: Leanna Battles, M.D.      1. Vaginal bleeding   2. Elevated serum hCG       MDM  26yo F had transvaginal US in ED 1 week ago that showed likely spontaneous abortion.  Presents to ED w/ heavy vaginal bleeding and lower abdominal cramping w/ associated lightheadedness. Has not scheduled f/u with OB.  Exam sig for passive of large  blood clots and bilateral adnexal ttp.  CBC, hCG and transvaginal US pending.  Pt declines anything for pain at this time.  Will continue to monitor VS.  Mathis Fare, PA-C to dispo.  6:26 AM         Otilio Miu, PA-C 07/09/12 [phone]

## 2012-07-09 NOTE — MAU Provider Note (Signed)
History     CSN: [id number]  Arrival date and time: 07/09/12 0101   First Provider Initiated Contact with Patient 07/09/12 [phone]      Chief Complaint  Patient presents with  . Vaginal Bleeding   HPI Christina Figueroa is a 27 y.o. G5 P4014 who was transferred to MAU from [facility] today for care of a possible ectopic pregnancy. The patient was seen at [facility] on 07/03/12 for vaginal bleeding and at that time [place] suggested a miscarriage. The patient was sent home with bleeding precautions. Her bleeding had stopped completely on Saturday of this week and then started again last night around 7pm. The patient was concerned because she was passing large clots and having some lower abdominal cramping. She returned to [facility] for evaluation and the [place] today was suspicious for ectopic pregnancy. The patient states that she is not bleeding upon arrival to [facility]. She has mild RLQ cramping. She denies fever, N/V.   OB History    Grav Para Term Preterm Abortions TAB SAB Ect Mult Living   5 4 4  1   1  4       History reviewed. No pertinent past medical history.  Past Surgical History  Procedure Date  . Cesarean section     History reviewed. No pertinent family history.  History  Substance Use Topics  . Smoking status: Never Smoker   . Smokeless tobacco: Not on file  . Alcohol Use: Yes     Comment: occasional     Allergies: No Known Allergies  No prescriptions prior to admission    ROS All negative unless otherwise noted in HPI Physical Exam   Blood pressure 112/91, pulse 95, temperature 98.9 F (37.2 C), temperature source Oral, resp. rate 18, height 5\' 2"  (1.575 m), weight 169 lb (76.658 kg), last menstrual period 06/23/2012, SpO2 100.00%.  Physical Exam  Constitutional: She is oriented to person, place, and time. She appears well-developed and well-nourished. No distress.  HENT:  Head: Normocephalic and atraumatic.  Cardiovascular: Normal rate, regular rhythm and normal heart  sounds.   Respiratory: Effort normal and breath sounds normal. No respiratory distress.  GI: Soft. She exhibits no distension and no mass. There is tenderness (mild tenderness to palpation of the RLQ). There is no rebound and no guarding.  Neurological: She is alert and oriented to person, place, and time.  Skin: Skin is warm and dry. No erythema.  Psychiatric: She has a normal mood and affect.   Results for orders placed during the hospital encounter of 07/09/12 (from the past 24 hour(s))  CBC WITH DIFFERENTIAL     Status: Abnormal   Collection Time   07/09/12  6:02 AM      Component Value Range   WBC 7.7  4.0 - 10.5 K/uL   RBC 4.21  3.87 - 5.11 MIL/uL   Hemoglobin 11.4 (*) 12.0 - 15.0 g/dL   HCT 95.6 (*) 21.3 - 08.6 %   MCV 83.6  78.0 - 100.0 fL   MCH 27.1  26.0 - 34.0 pg   MCHC 32.4  30.0 - 36.0 g/dL   RDW 57.8  46.9 - 62.9 %   Platelets 167  150 - 400 K/uL   Neutrophils Relative 63  43 - 77 %   Neutro Abs 4.9  1.7 - 7.7 K/uL   Lymphocytes Relative 26  12 - 46 %   Lymphs Abs 2.0  0.7 - 4.0 K/uL   Monocytes Relative 9  3 - 12 %  Monocytes Absolute 0.7  0.1 - 1.0 K/uL   Eosinophils Relative 2  0 - 5 %   Eosinophils Absolute 0.1  0.0 - 0.7 K/uL   Basophils Relative 0  0 - 1 %   Basophils Absolute 0.0  0.0 - 0.1 K/uL  BASIC METABOLIC PANEL     Status: Normal   Collection Time   07/09/12  6:02 AM      Component Value Range   Sodium 136  135 - 145 mEq/L   Potassium 3.8  3.5 - 5.1 mEq/L   Chloride 102  96 - 112 mEq/L   CO2 25  19 - 32 mEq/L   Glucose, Bld 97  70 - 99 mg/dL   BUN 12  6 - 23 mg/dL   Creatinine, Ser 1.61  0.50 - 1.10 mg/dL   Calcium 9.1  8.4 - 09.6 mg/dL   GFR calc non Af Amer >90  >90 mL/min   GFR calc Af Amer >90  >90 mL/min  HCG, QUANTITATIVE, PREGNANCY     Status: Abnormal   Collection Time   07/09/12  6:02 AM      Component Value Range   hCG, Beta Chain, Quant, S 9744 (*) <5 mIU/mL  HEPATIC FUNCTION PANEL     Status: Abnormal   Collection Time    07/09/12  6:02 AM      Component Value Range   Total Protein 6.6  6.0 - 8.3 g/dL   Albumin 3.3 (*) 3.5 - 5.2 g/dL   AST 14  0 - 37 U/L   ALT 8  0 - 35 U/L   Alkaline Phosphatase 68  39 - 117 U/L   Total Bilirubin 0.2 (*) 0.3 - 1.2 mg/dL   Bilirubin, Direct <0.4  0.0 - 0.3 mg/dL   Indirect Bilirubin NOT CALCULATED  0.3 - 0.9 mg/dL    *RADIOLOGY REPORT*   Clinical Data: Vaginal bleeding and pain.  OBSTETRIC <14 WK [place] AND TRANSVAGINAL OB US   Technique: Both transabdominal and transvaginal ultrasound  examinations were performed for complete evaluation of the  gestation as well as the maternal uterus, adnexal regions, and  pelvic cul-de-sac. Transvaginal technique was performed to assess  early pregnancy.   Comparison: 07/03/2012.   Intrauterine gestational sac: Absent.  Yolk sac: Absent.  Embryo: Absent.  Cardiac Activity: Absent.   Maternal uterus/adnexae:  No subchorionic hemorrhage. Ovaries are visualized. Hypoechoic  thickening is seen in the endometrium with debris in the lower  uterine segment. An anechoic structure with increased through  transmission is seen in the left adnexa, measuring 1.6 x 0.7 x 1.1  cm. Small free fluid.   IMPRESSION:  1. No viable intrauterine pregnancy.  2. Hypoechoic debris and thickening within the endometrial canal  and lower uterine segment.  3. Focal anechoic structure in the left ovary is new and may  represent focal free fluid.  4. Small free fluid.   Original Report Authenticated By: Leanna Battles, M.D.   Reyes Ivan, MD Mon Jul 09, 2012 7:41:09 AM EST       **ADDENDUM** CREATED: 07/09/2012 07:38:40  Discussion with Dr. Read Drivers reveals interval doubling quantitative  beta HCG from the previous ultrasound. Ectopic pregnancy cannot be  excluded.  **END ADDENDUM** SIGNED BY: Reyes Ivan, M.D.    MAU Course  Procedures None  MDM Patient arrives from [facility]. She is stable and only having mild pain. The patient does  not want pain medication at this time.  Discussed patient with Dr.  Anyanwu. She recommends MTX today with appropriate follow-up of hCG. Discussed MTX vs. Surgery with the patient. She would prefer to try MTX at this time. Precautions given. Information sheets given.   Assessment and Plan  A: Ectopic pregnancy  P: Discharge home MTX today Follow-up quant hCG on Thursday and Sunday Patient cautioned to return to MAU with excessive bleeding or pain uncontrolled by pain medication Patient cautioned not to take any prenatal vitamins, folic acid or NSAIDs and avoid alcohol   Freddi Starr, PA-C 07/09/2012, 10:51 AM

## 2012-07-09 NOTE — MAU Provider Note (Signed)
Attestation of Attending Supervision of Advanced Practitioner (PA/CNM/NP): Evaluation and management procedures were performed by the Advanced Practitioner under my supervision and collaboration.  I have reviewed the Advanced Practitioner's note and chart, and I agree with the management and plan.  Jenasia Dolinar, MD, FACOG Attending Obstetrician & Gynecologist Faculty Practice, [facility]  

## 2012-07-09 NOTE — ED Provider Notes (Signed)
Care assumed at shift change. 27 y/o female with vaginal bleeding seen last week for miscarriage. HCG higher today than last week. [place] concerning for echoic mass on left ovary. Spoke with Dr. Debroah Loop at [facility] who will accept patient for transfer. Dr. Read Drivers aware and agrees with plan.  Trevor Mace, PA-C 07/09/12 [phone]

## 2012-07-09 NOTE — ED Notes (Signed)
Patient states she was told she had a miscarriage, confirmed by ultrasound, last week here in the ER. Patient states she had a small amount of bleeding for 5-6 days, the bleeding stopped yesterday and now has started back around 1900 at a greater flow, patient states she put a pad on and it was socked so quickly that she just sat on the toilet and soaked in the bathtub until coming in. Patient spoke with a nurse, she believes at [facility], who told her if she soaked more than a pad an hour for two hours to come in for evaluation. Patient c/o abd cramping as well.

## 2012-07-11 NOTE — ED Provider Notes (Signed)
Medical screening examination/treatment/procedure(s) were performed by non-physician practitioner and as supervising physician I was immediately available for consultation/collaboration.   Hanley Seamen, MD 07/11/12 2252

## 2012-07-12 ENCOUNTER — Inpatient Hospital Stay (HOSPITAL_COMMUNITY)
Admission: AD | Admit: 2012-07-12 | Discharge: 2012-07-12 | Disposition: A | Discharge status: Home / Self Care | Payer: Self-pay | Source: Ambulatory Visit | Attending: Obstetrics and Gynecology | Admitting: Obstetrics and Gynecology

## 2012-07-12 DIAGNOSIS — O00109 Unspecified tubal pregnancy without intrauterine pregnancy: Secondary | ICD-10-CM | POA: Insufficient documentation

## 2012-07-12 DIAGNOSIS — O009 Unspecified ectopic pregnancy without intrauterine pregnancy: Secondary | ICD-10-CM

## 2012-07-12 LAB — HCG, QUANTITATIVE, PREGNANCY: hCG, Beta Chain, Quant, S: 3153 m[IU]/mL — ABNORMAL HIGH (ref <5)

## 2012-07-12 NOTE — MAU Note (Signed)
Pt states had MTX injection on 07/09/2012, bleeding very small amt, cramping intermittently, occasionally gets worse, rates pain 0/10 at present.

## 2012-07-12 NOTE — MAU Provider Note (Signed)
Attestation of Attending Supervision of Advanced Practitioner (CNM/NP): Evaluation and management procedures were performed by the Advanced Practitioner under my supervision and collaboration.  I have reviewed the Advanced Practitioner's note and chart, and I agree with the management and plan.  Vaudine Dutan 07/12/2012 1:58 PM

## 2012-07-12 NOTE — MAU Provider Note (Signed)
History   Chief Complaint:  Follow-up MTX, BHCG    Christina Figueroa is  27 y.o. [id number] Patient's last menstrual period was 06/23/2012.Christina Figueroa Patient is here for follow up of quantitative HCG after Dx ectopic and MTX 07/09/12.     Since her last visit, the patient is without new complaint.   The patient reports bleeding as  spotting.  Mild cramping.  General ROS:  negative  Her previous Quantitative HCG values are:  07/10/12: 9744  Physical Exam   Blood pressure 102/52, pulse 85, temperature 98 F (36.7 C), temperature source Oral, resp. rate 16, height 5\' 2"  (1.575 m), weight 77.282 kg (170 lb 6 oz), last menstrual period 06/23/2012.  Focused Gynecological Exam: examination not indicated  Labs: Recent Results (from the past 24 hour(s))  HCG, QUANTITATIVE, PREGNANCY   Collection Time   07/12/12 10:50 AM      Component Value Range   hCG, Beta Chain, Quant, S 3153 (*) <5 mIU/mL    Ultrasound Studies:   NA  Assessment: Day 4 S/P MTX for ectopic. Quants dropping well.    Plan: The patient is instructed to follow up 07/15/12 for quant, then weekly intil >1.  Ectopic precautions.   WEALTHY, DANIELSKI 07/12/2012, 12:15 PM

## 2012-07-15 ENCOUNTER — Inpatient Hospital Stay (HOSPITAL_COMMUNITY)
Admission: AD | Admit: 2012-07-15 | Discharge: 2012-07-15 | Disposition: A | Discharge status: Home / Self Care | Payer: Self-pay | Source: Ambulatory Visit | Attending: Obstetrics and Gynecology | Admitting: Obstetrics and Gynecology

## 2012-07-15 DIAGNOSIS — R109 Unspecified abdominal pain: Secondary | ICD-10-CM | POA: Insufficient documentation

## 2012-07-15 DIAGNOSIS — O00109 Unspecified tubal pregnancy without intrauterine pregnancy: Secondary | ICD-10-CM | POA: Insufficient documentation

## 2012-07-15 DIAGNOSIS — O009 Unspecified ectopic pregnancy without intrauterine pregnancy: Secondary | ICD-10-CM

## 2012-07-15 LAB — HCG, QUANTITATIVE, PREGNANCY: hCG, Beta Chain, Quant, S: 1543 m[IU]/mL — ABNORMAL HIGH (ref <5)

## 2012-07-15 NOTE — MAU Note (Signed)
Pt states here for repeat bhcg only, denies pain or bleeding.

## 2012-07-15 NOTE — MAU Provider Note (Signed)
History   Chief Complaint:  No chief complaint on file.   Christina Figueroa is  27 y.o. [id number] Patient's last menstrual period was 06/23/2012.[name] Patient is here for follow up of quantitative HCG and ongoing surveillance of pregnancy status.  Since her last visit, the patient is without new complaint.   The patient reports bleeding that is bright red, small amt, changes pad < 3x/day.  General ROS:  positive for mild cramping. Neg for dizziness  Her previous Quantitative HCG values are: 07/03/12-   5,468 07/09/12- 9,744. MTX was given 07/12/12- 3,151- day 4     Physical Exam   Last menstrual period 06/23/2012.  Focused Gynecological Exam:  WD, WN, AA female, appears comfortable  Labs: Results for orders placed during the hospital encounter of 07/15/12 (from the past 24 hour(s))  HCG, QUANTITATIVE, PREGNANCY     Status: Abnormal   Collection Time   07/15/12  1:25 PM      Component Value Range   hCG, Beta Chain, Quant, S 1543 (*) <5 mIU/mL    No results found for this or any previous visit (from the past 24 hour(s)).  Ultrasound Studies:   US Ob Comp Less 14 Wks  07/09/2012  **ADDENDUM** CREATED: 07/09/2012 07:38:40  Discussion with Dr. Read Drivers reveals interval doubling quantitative beta HCG from the previous ultrasound.  Ectopic pregnancy cannot be excluded.  **END ADDENDUM** SIGNED BY: Reyes Ivan, M.D.   07/09/2012  *RADIOLOGY REPORT*  Clinical Data: Vaginal bleeding and pain.  OBSTETRIC <14 WK [place] AND TRANSVAGINAL OB US  Technique:  Both transabdominal and transvaginal ultrasound examinations were performed for complete evaluation of the gestation as well as the maternal uterus, adnexal regions, and pelvic cul-de-sac.  Transvaginal technique was performed to assess early pregnancy.  Comparison:  07/03/2012.  Intrauterine gestational sac:  Absent. Yolk sac: Absent. Embryo: Absent. Cardiac Activity: Absent.  Maternal uterus/adnexae: No subchorionic hemorrhage.  Ovaries are visualized.   Hypoechoic thickening is seen in the endometrium with debris in the lower uterine segment.  An anechoic structure with increased through transmission is seen in the left adnexa, measuring 1.6 x 0.7 x 1.1 cm.  Small free fluid.  IMPRESSION:  1.  No viable intrauterine pregnancy. 2.  Hypoechoic debris and thickening within the endometrial canal and lower uterine segment. 3.  Focal anechoic structure in the left ovary is new and may represent focal free fluid. 4.  Small free fluid.   Original Report Authenticated By: Leanna Battles, M.D.    US Ob Comp Less 14 Wks  07/03/2012  *RADIOLOGY REPORT*  Clinical Data: Pregnant patient with vaginal bleeding and cramping. Quantitative HCG 5468.  OBSTETRIC <14 WK [place] AND TRANSVAGINAL OB US  Technique:  Both transabdominal and transvaginal ultrasound examinations were performed for complete evaluation of the gestation as well as the maternal uterus, adnexal regions, and pelvic cul-de-sac.  Transvaginal technique was performed to assess early pregnancy.  Comparison:  None.  Intrauterine gestational sac:  Not visualized. The lower uterine segment appears heterogeneous and appears to contain some debris. Yolk sac: Not visualized. Embryo: Not visualized. Cardiac Activity: Not applicable.  Maternal uterus/adnexae: Ovaries appear normal.  No adnexal mass is identified.  IMPRESSION: Small amount of debris in the lower uterine segment may be due to abortion in progress but cannot be definitively characterized.  No evidence of ectopic pregnancy is present. Findings are suspicious but not yet definitive for failed pregnancy.  Recommend follow-up US in 10-14 days for definitive diagnosis. Recommend correlation with serial  quantitative HCG.  This recommendation follows SRU consensus guidelines: Diagnostic Criteria for Nonviable Pregnancy Early in the First Trimester.  Malva Limes Med 2013; 086:5784-69.   Original Report Authenticated By: Holley Dexter, M.D.    US Ob  Transvaginal  07/09/2012  **ADDENDUM** CREATED: 07/09/2012 07:38:40  Discussion with Dr. Read Drivers reveals interval doubling quantitative beta HCG from the previous ultrasound.  Ectopic pregnancy cannot be excluded.  **END ADDENDUM** SIGNED BY: Reyes Ivan, M.D.   07/09/2012  *RADIOLOGY REPORT*  Clinical Data: Vaginal bleeding and pain.  OBSTETRIC <14 WK [place] AND TRANSVAGINAL OB US  Technique:  Both transabdominal and transvaginal ultrasound examinations were performed for complete evaluation of the gestation as well as the maternal uterus, adnexal regions, and pelvic cul-de-sac.  Transvaginal technique was performed to assess early pregnancy.  Comparison:  07/03/2012.  Intrauterine gestational sac:  Absent. Yolk sac: Absent. Embryo: Absent. Cardiac Activity: Absent.  Maternal uterus/adnexae: No subchorionic hemorrhage.  Ovaries are visualized.  Hypoechoic thickening is seen in the endometrium with debris in the lower uterine segment.  An anechoic structure with increased through transmission is seen in the left adnexa, measuring 1.6 x 0.7 x 1.1 cm.  Small free fluid.  IMPRESSION:  1.  No viable intrauterine pregnancy. 2.  Hypoechoic debris and thickening within the endometrial canal and lower uterine segment. 3.  Focal anechoic structure in the left ovary is new and may represent focal free fluid. 4.  Small free fluid.   Original Report Authenticated By: Leanna Battles, M.D.    US Ob Transvaginal  07/03/2012  *RADIOLOGY REPORT*  Clinical Data: Pregnant patient with vaginal bleeding and cramping. Quantitative HCG 5468.  OBSTETRIC <14 WK [place] AND TRANSVAGINAL OB US  Technique:  Both transabdominal and transvaginal ultrasound examinations were performed for complete evaluation of the gestation as well as the maternal uterus, adnexal regions, and pelvic cul-de-sac.  Transvaginal technique was performed to assess early pregnancy.  Comparison:  None.  Intrauterine gestational sac:  Not visualized. The lower uterine  segment appears heterogeneous and appears to contain some debris. Yolk sac: Not visualized. Embryo: Not visualized. Cardiac Activity: Not applicable.  Maternal uterus/adnexae: Ovaries appear normal.  No adnexal mass is identified.  IMPRESSION: Small amount of debris in the lower uterine segment may be due to abortion in progress but cannot be definitively characterized.  No evidence of ectopic pregnancy is present. Findings are suspicious but not yet definitive for failed pregnancy.  Recommend follow-up US in 10-14 days for definitive diagnosis. Recommend correlation with serial quantitative HCG.  This recommendation follows SRU consensus guidelines: Diagnostic Criteria for Nonviable Pregnancy Early in the First Trimester.  Malva Limes Med 2013; 629:5284-13.   Original Report Authenticated By: Holley Dexter, M.D.     Assessment: Ectopic pregnancy, day 7 resolving after MTX    Plan: Return 1 wk repeat BHCG Ectopic precautions reviewed.  Thayer Inabinet, Olegario Messier M. 07/15/2012, 1:39 PM

## 2012-07-16 NOTE — MAU Provider Note (Signed)
Attestation of Attending Supervision of Advanced Practitioner: Evaluation and management procedures were performed by the PA/NP/CNM/OB Fellow under my supervision/collaboration. Chart reviewed and agree with management and plan.  Zaydn Gutridge V 07/16/2012 10:39 PM

## 2012-07-22 ENCOUNTER — Inpatient Hospital Stay (HOSPITAL_COMMUNITY)
Admission: AD | Admit: 2012-07-22 | Discharge: 2012-07-22 | Disposition: A | Discharge status: Home / Self Care | Payer: Self-pay | Source: Ambulatory Visit | Attending: Obstetrics & Gynecology | Admitting: Obstetrics & Gynecology

## 2012-07-22 DIAGNOSIS — Z09 Encounter for follow-up examination after completed treatment for conditions other than malignant neoplasm: Secondary | ICD-10-CM

## 2012-07-22 DIAGNOSIS — O009 Unspecified ectopic pregnancy without intrauterine pregnancy: Secondary | ICD-10-CM

## 2012-07-22 DIAGNOSIS — O00109 Unspecified tubal pregnancy without intrauterine pregnancy: Secondary | ICD-10-CM | POA: Insufficient documentation

## 2012-07-22 NOTE — MAU Provider Note (Signed)
Attestation of Attending Supervision of Advanced Practitioner (CNM/NP): Evaluation and management procedures were performed by the Advanced Practitioner under my supervision and collaboration.  I have reviewed the Advanced Practitioner's note and chart, and I agree with the management and plan.  HARRAWAY-Okelley, Leiani Enright 3:13 PM

## 2012-07-22 NOTE — MAU Provider Note (Signed)
  History     CSN: [id number]  Arrival date and time: 07/22/12 1328   None     Chief Complaint  Patient presents with  . Labs Only   HPI Christina Figueroa is 27 y.o. [id number] Unknown weeks presenting for follow up BHCG>  She has been followed since 07/03/12.  She was initially seen at [facility] for bleeding in pregnancy thought to be miscarriage and then here when Ectopic was suspected on repeat Ultrasound.  She was given MTX on 1/13 and has followed per protocol.  BHCG have dropped from 9744 on day of MTX to 1543 on day 7 of MTX.  She is having abdominal discomfort that is the same as it has been.  Tylenol for discomfort.  Neg for vaginal bleeding.   No past medical history on file.  Past Surgical History  Procedure Date  . Cesarean section     No family history on file.  History  Substance Use Topics  . Smoking status: Never Smoker   . Smokeless tobacco: Not on file  . Alcohol Use: Yes     Comment: occasional     Allergies: No Known Allergies  Prescriptions prior to admission  Medication Sig Dispense Refill  . acetaminophen (TYLENOL) 500 MG tablet Take 1,000 mg by mouth daily as needed. For pain        Review of Systems  Constitutional: Negative.   Gastrointestinal: Positive for abdominal pain. Vomiting: mild.  Genitourinary:       Neg for vaginal bleeding   Physical Exam   Blood pressure 120/89, pulse 94, temperature 98 F (36.7 C), temperature source Oral, resp. rate 18, last menstrual period 06/23/2012.  Physical Exam  Constitutional: She is oriented to person, place, and time. She appears well-developed and well-nourished. No distress.  HENT:  Head: Normocephalic.  Neck: Normal range of motion.  Cardiovascular: Normal rate.   Respiratory: Effort normal.  GI: Soft. She exhibits no distension and no mass. There is no tenderness. There is no rebound and no guarding.  Genitourinary:       Not indicated  Neurological: She is alert and oriented to person, place, and  time.  Psychiatric: She has a normal mood and affect. Her behavior is normal.   Results for orders placed during the hospital encounter of 07/22/12 (from the past 24 hour(s))  HCG, QUANTITATIVE, PREGNANCY     Status: Abnormal   Collection Time   07/22/12  1:49 PM      Component Value Range   hCG, Beta Chain, Quant, S 128 (*) <5 mIU/mL   MAU Course  Procedures  MDM   Assessment and Plan  A:  Ectopic pregnancy treated with Methotrexate      Beta HCG values dropping  P:  Follow up in the GYN CLINIC in 2 weeks for last BHCG and contraception counseling--patient would like to use Depo Provera again.       Referral requested       KEY,EVE M 07/22/2012, 2:16 PM

## 2013-07-25 ENCOUNTER — Ambulatory Visit: Payer: Self-pay | Admitting: Obstetrics and Gynecology

## 2013-07-29 ENCOUNTER — Ambulatory Visit: Payer: Self-pay | Admitting: Obstetrics and Gynecology

## 2013-07-29 ENCOUNTER — Encounter: Payer: Self-pay | Admitting: Obstetrics and Gynecology

## 2013-07-29 VITALS — BP 120/82 | Ht 62.0 in | Wt 189.0 lb

## 2013-07-29 DIAGNOSIS — Z113 Encounter for screening for infections with a predominantly sexual mode of transmission: Secondary | ICD-10-CM

## 2013-07-29 DIAGNOSIS — N926 Irregular menstruation, unspecified: Secondary | ICD-10-CM

## 2013-07-29 DIAGNOSIS — Z0000 Encounter for general adult medical examination without abnormal findings: Secondary | ICD-10-CM

## 2013-07-29 DIAGNOSIS — IMO0001 Reserved for inherently not codable concepts without codable children: Secondary | ICD-10-CM

## 2013-07-29 DIAGNOSIS — Z800 Family history of malignant neoplasm of digestive organs: Secondary | ICD-10-CM

## 2013-07-29 DIAGNOSIS — E282 Polycystic ovarian syndrome: Secondary | ICD-10-CM | POA: Insufficient documentation

## 2013-07-29 LAB — POCT URINE PREGNANCY: Lot #: 107368

## 2013-07-29 MED ORDER — NORETHIN ACE-ETH ESTRAD-FE 1.5-30 MG-MCG PO TABS *A*
1.0000 | ORAL_TABLET | Freq: Every day | ORAL | Status: DC
Start: 2013-07-29 — End: 2018-05-14

## 2013-07-29 NOTE — Progress Notes (Signed)
Routine Annual Exam:     HPI  Linda Oneal is a 28 y.o. [phone] who presents to clinic today for annual exam.  Patient complaints of irregular menses and hirsutism that has worsen over the past few years.  See separate note regarding PCOS diagnosis.  Patient has no other complaints.       Patient denies tobacco use and migraine headaches.        PAST MEDICAL HISTORY  Past Medical History   Diagnosis Date    Obese      BMI 31    Hx: UTI (urinary tract infection)     Postpartum depression     Vaginal yeast infection     BV (bacterial vaginosis)     Chlamydia contact, treated 07/2009        PAST SURGICAL HISTORY  Past Surgical History   Procedure Laterality Date    Cesarean section          HOME MEDICATIONS  Prior to Admission medications    Not on File        ALLERGIES  No Known Allergies (drug, envir, food or latex)     OBSTETRIC HISTORY  OB History    Grav Para Term Preterm Abortions TAB SAB Ect Mult Living    _0 0 0 0 0 0 0 4           GYNECOLOGIC HISTORY  LMP: 03/26/2013  Menarche: age 43  Menses: irregular  Cramps: Mild     Coitarche: age 51  Abnormal paps: No;   STI History: chlamydia (treated this year)  Sexual History: heterosexual  Contraception: Condoms (sometimes)  Sexual dysfunction: No  Incontinence: No    SCREENING HISTORY  Last mammogram:  n/a  Last colonoscopy:  n/a  Last lipid testing:  n/a  Last DEXA:  n/a    IMMUNIZATIONS  Flu shot: declines  Gardasil: Yes  TdaP: Yes (per patient)    [place]  Patient reports that she has never smoked. She does not have any smokeless tobacco history on file. She reports that she currently engages in sexual activity. She reports using the following method of birth control/protection: Condom. She reports that she does not drink alcohol. Her drug history is not on file.    Occupation: Materials engineer  Highest level of education completed: [place] in the home: No  Feels safe in current relationship: Yes  History of  domestic violence: No  History of sexual abuse: No  History of emotional abuse: No  Depressive symptoms: Yes during pregnancy  Wears seat belt in the car: Yes  Health Care Proxy: no  Diet/exercise: Poor diet, does not exercise regularly  Wears sunscreen: No  Last dental exam: Years  Last eye exam: Last year    FAMILY HISTORY  Family History   Problem Relation Age of Onset    Hypertension Paternal Grandfather     Breast cancer Paternal Grandmother     Hypertension Paternal Grandmother     Hypertension Father     Diabetes Father     Colon cancer Sister 31     ? BRCA testing    Diabetes Other         REVIEW OF SYSTEMS  General: Negative  ENT: Negative  Respiratory: Negative  Cardiovascular: Negative  Gastrointestinal: Negative  Genito-Urinary: Negative  Musculoskeletal: Negative     Neurological: Negative  Psychiatric: Negative  Endocrine: Negative    PHYSICAL EXAM  Filed Vitals:  07/29/13 1151   BP: 120/82   Height: 1.575 m (_0 )   Weight: 85.73 kg (189 lb)     Body mass index is 34.56 kg/(m^2).     General:  NAD, well-appearing  HEENT:  NC/AT, no neck masses, thyroid mobile without nodularity  Breasts:  No abnormalities on inspection, no nipple discharge or bleeding, no palpable masses or nodularity  Lungs:  Clear to auscultation bilaterally, normal respiratory effort  Heart:  S1S2 regular rate and rhythm  Abdomen:  +BS, soft, non-tender, no masses or organomegaly  Pelvic:  Normal external genitalia, vagina normal with no lesions or discharge, cervix with no lesions or discharge, uterus non-tender with regular contour, normal adnexa  Lymphatic:  No cervical, supraclavicular or axillary lymphadenopathy  Extremities:  no edema, peripheral pulses intact    Cultures collected:  Collected and sent    ASSESSMENT AND PLAN  28 y.o. [id number] Caucasian female presents for routine annual visit. (See separate note for PCOS diagnosis)    Routine Annual Exam:  -- STD screening:  Comprehensive STD screening, verbal  consent obtained for HIV screening.    -- Contraception:  OCP. No contraindication identified.    -- Cervical cancer screening:  Pap collected and sent  -- Other screening:  Patient again informed that she should have a colonoscopy for colon cancer screening/prevention due to sister dx with colon cancer at age 41.  Patient is considering but ultimately declines.     -- Encouraged exercise and healthy choices    Return to clinic as needed  Discussed with Dr. Leitha Schuller, MD   Ob-Gyn PGY-2  Pager: [phone]    [facility]    -------------------------------------------------------------------------------------------  OB/GYN Attending Attestation  This patient, including her history and exam findings, and her plan of care was reviewed with Dr. Earle Oneal at the time of her visit.  I saw and evaluated the patient with Dr. Earle Oneal.  I agree with above.  Briefly this is a 66 y.Linda Oneal who presents for GYN annual exam.    Plan:   Pap: Due today.  STD testing: For comprehensive STD screening.  Contraception plan: OCP's  Mammogram: No indications for early screening.  Colon cancer screening: Her sister had colon cancer at age 21.  Recommend colon cancer screening.  Dexa: No indications for early screening.  Vaccinations: Encouraged patient to follow with her PCP regarding vaccinations.     Linda Spare, MD  07/29/2013  12:51 PM

## 2013-07-29 NOTE — Patient Instructions (Signed)
Healthy Practices for Women of all Ages     In addition to your regular OB/GYN history, physical, cholesterol screening, Pap smear, and other recommended cancer screening tests, we ask that you review this list of “healthy practices.”  Modifying your daily activities according to these practices may improve your overall health and well-being.  In the event of questions about this list, ask your doctor or health care provider.  Additionally, there are specially written pamphlets available, which offer further information.    Diet and Exercise:    Limit fat and cholesterol; emphasize fruits, grains and vegetables.    Consume dairy products or use calcium supplementation for adequate calcium intake (1200 mg or more).    Add folic acid supplementation 0.4 mg (400 micrograms, present in most daily multivitamins) at least two months before considering pregnancy to reduce the risks of birth defects.    Participate in regular exercise for 30 minutes at least five times a week, and consider weight training.    Injury Prevention:    Seat/lap belts should be worn while in a moving car.    Helmet should be used when using motorcycles, bicycles, roller blades and ATV’s or skiing.    Place approved smoke detectors in your house and replace the batteries twice a year.      Guns and other firearms should be stored unloaded and in a locked area.  Trigger locks should be used as well.    Consider CPR training for household members.    Dental Health:    Schedule regular visits to the dentist.    Floss and brush with fluoride toothpaste daily.    Immunizations:    A tetanus/diphtheria booster shot (d/T) is recommended every 10 years.    An MMR vaccine is recommended for non-pregnant women born after 1956 without proof of immunity of documentation of previous immunization.  Adults who are susceptible to varicella (chicken pox) should be vaccinated.    Influenza vaccine is indicated yearly for women age 65 and older, or at any age based  on medical history and exposure.  Pregnant women over [redacted] weeks gestation should receive the flu vaccine as well.    Pneumococcal pneumonia vaccine is indicated for age 65 and older once in your life.    Hepatitis A and/or B vaccines are recommended for high-risk individuals.    Substance Abuse:    Stop smoking; do not use any other tobacco products.    Avoid alcohol use when driving, boating, swimming or operating other machinery. Avoid excessive use of alcoholic beverages.    Recreational drug use (marijuana, cocaine, etc.) is dangerous and can be habit-forming.    Sexual Behavior:    Be sure to use contraception if pregnancy is not desired.    Regular use of female or female condoms with spermicide helps prevent STD’s.    Consider HIV testing if:  1. You have had more than one sexual partner.  2. You have had any STD’s.  3. You have used intravenous drugs.  4. You have a sexual partner with these (the above) risk factors.  5. Your sexual partner has had female homosexual exposure.  6. You received a blood transfusion during 1978-1985.    Breast Health:    Breast self-examinations should be done monthly (after your period).    A mammogram should be done once between ages 35-40, then every 1-2 years based on risk factors.  If there is a strong family history of premenopausal breast cancer, you should   start with mammograms earlier than age 58.    Colon Cancer Surveillance:    Beginning at age 41, stool occult blood screening should be done annually and/or sigmoidoscopy (scope examination of the colon) every 3-5 years.    Women and Alcohol  For women, alcohol use carries some unique and special concerns. Most studies on alcohol have been on men, but we now know women may respond differently to alcohol.   Concerns: Liver damage, cancer, menstrual cycle changes, birth defects, fetal alcohol syndrome, intoxication, nutrition and weight management.  All women should avoid all alcohol during pregnancy.  Help for Problem  Drinking:    One in every three members of Alcoholics Anonymous (AA) is now a woman.  AA meetings are held regularly and have led the way in demonstrating the effectiveness of self-help.    Outpatient and Inpatient Treatment are also available.  Ask your health care provider for a referral.    The first step in successful treatment is to recognize that there is a problem and accept help.    Dietary and Supplemental Calcium  It is important that you build and protect your bone mass through a program of regular exercise and proper nutrition with adequate amounts of calcium in your diet. Dairy products are considered to be the most important sources of calcium.    Reviewed 03/2010

## 2013-07-29 NOTE — Progress Notes (Addendum)
GYNECOLOGY ACUTE VISIT    CC: Irregular menses    HPI  28 y.o. [id number] presents to clinic today for annual exam.  Patient complaints of irregular menses and worsening hirsutism.  Patient has not been diagnosed with PCOS but suspects diagnoses after researching online.  Patient has been bleeding intermittently the past few months and states that her last "real period" was in September.  Patient denies other associated symptoms.  She has no other complaints.      Patient's medications, allergies, and past medical, surgical, family and social history reviewed and updated.      Domestic violence screening:  No trauma, violence or abuse    REVIEW OF SYSTEMS  Constitutional, cardiovascular, respiratory, GI and GU systems negative except for HPI    PHYSICAL EXAM  LMP Vitals   Item Reading    BP 120/82    Ht 1.575 m (5\' 2" )    Wt 85.73 kg (189 lb)        General: no acute distress  Abdomen: soft, non-tender, non-distended  Pelvic: normal appearing external genitalia, vagina and parous cervix.  Patient did not tolerate bimanual exam.  No adnexal masses.    Extremities: no edema noted    ASSESSMENT AND PLAN  28 y.o. [id number] with PCOS by [place] oligo-ovulation & Hirsutism.      PCOS  -- patient counseled on increased risk of metabolic syndrome.  Encouraged patient to make healthy lifestyle choices.  She was instructed to increase fruit and vegetable intake while decreasing junk food and simple carbohydrates.  Patient encouraged to exercise.  Encouraged patient to make an appointment with primary care provider for further evaluation and treatment of obesity/metabolic disorder.    -- OCP sent to pharmacy    Return to clinic 3 months for follow up visit  Discussed with Dr. Jobie Quaker    --------------------------------------------------------------------------------------------  OB/GYN Attending Attestation  This patient, including her history and exam findings, and her plan of care was reviewed with Dr. Earle Gell at the time of  her visit.  I agree with above.  Briefly this is a 16 y.U.[id number] who presents for evaluation of concern for PCOS.  She requests that this be addressed today for her convenience.  Plan: OCP's.    Berna Spare, MD  07/29/2013  12:51 PM

## 2013-07-30 LAB — N. GONORRHOEAE DNA AMPLIFICATION: N. gonorrhoeae DNA Amplification: 0

## 2013-07-30 LAB — CHLAMYDIA PLASMID DNA AMPLIFICATION: Chlamydia Plasmid DNA Amplification: 0

## 2013-08-02 LAB — HPV DNA PROBE WITH CYTOLOGY
HPV Other High Risk: NEGATIVE
HPV Type 16: NEGATIVE
HPV Type 18: NEGATIVE

## 2013-08-02 LAB — GYN CYTOLOGY

## 2013-08-06 ENCOUNTER — Encounter: Payer: Self-pay | Admitting: Obstetrics and Gynecology

## 2013-08-06 DIAGNOSIS — R87619 Unspecified abnormal cytological findings in specimens from cervix uteri: Secondary | ICD-10-CM | POA: Insufficient documentation

## 2013-10-28 ENCOUNTER — Ambulatory Visit: Payer: Self-pay | Admitting: Obstetrics and Gynecology

## 2014-02-26 ENCOUNTER — Emergency Department (HOSPITAL_COMMUNITY)
Admission: EM | Admit: 2014-02-26 | Discharge: 2014-02-26 | Disposition: A | Discharge status: Home / Self Care | Payer: BC Managed Care – PPO | Attending: Emergency Medicine | Admitting: Emergency Medicine

## 2014-02-26 DIAGNOSIS — R110 Nausea: Secondary | ICD-10-CM | POA: Insufficient documentation

## 2014-02-26 DIAGNOSIS — K089 Disorder of teeth and supporting structures, unspecified: Secondary | ICD-10-CM | POA: Diagnosis present

## 2014-02-26 DIAGNOSIS — K047 Periapical abscess without sinus: Secondary | ICD-10-CM | POA: Diagnosis not present

## 2014-02-26 DIAGNOSIS — K029 Dental caries, unspecified: Secondary | ICD-10-CM | POA: Diagnosis not present

## 2014-02-26 DIAGNOSIS — R599 Enlarged lymph nodes, unspecified: Secondary | ICD-10-CM | POA: Diagnosis not present

## 2014-02-26 MED ORDER — HYDROCODONE-ACETAMINOPHEN 5-325 MG PO TABS: 1.0000 | ORAL_TABLET | Freq: Four times a day (QID) | ORAL | Status: DC | PRN

## 2014-02-26 MED ORDER — DOXYCYCLINE HYCLATE 100 MG PO CAPS: 100.0000 mg | ORAL_CAPSULE | Freq: Two times a day (BID) | ORAL | Status: DC

## 2014-02-26 MED ORDER — NAPROXEN 500 MG PO TABS: 500.0000 mg | ORAL_TABLET | Freq: Two times a day (BID) | ORAL | Status: DC | PRN

## 2014-02-26 NOTE — ED Provider Notes (Signed)
CSN: [id number]     Arrival date & time 02/26/14  1832 History  This chart was scribed for non-physician practitioner, Allen Derry, PA-C working with Rolan Bucco, MD by Luisa Dago, ED scribe. This patient was seen in room WTR6/WTR6 and the patient's care was started at 7:25 PM.    Chief Complaint  Patient presents with  . Dental Pain   Patient is a 28 y.o. female presenting with tooth pain. The history is provided by the patient. No language interpreter was used.  Dental Pain Location:  Lower Lower teeth location:  17/LL 3rd molar, 18/LL 2nd molar and 19/LL 1st molar Quality:  Throbbing, pressure-like and radiating Severity:  Moderate Onset quality:  Gradual Duration:  1 week Timing:  Intermittent Progression:  Worsening Chronicity:  New Context: not abscess, cap still on, not enamel fracture, filling still in place and not malocclusion   Relieved by:  Nothing Worsened by:  Touching, pressure and hot food/drink Ineffective treatments:  Acetaminophen Associated symptoms: facial pain   Associated symptoms: no congestion, no drooling, no facial swelling, no fever, no gum swelling, no headaches, no neck pain, no oral bleeding and no oral lesions   Risk factors: no cancer and no smoking    HPI Comments: Christina Figueroa is a 28 y.o. female who presents to the Emergency Department complaining of worsening intermittent 7/10 dental pain that started approximately 1 week ago. Pt states that her symptoms started with associated pain to the left side of her face, but now it is radiating up to her left ear and down into her throat. She describes the pain as a "throbbing" in nature. Pt states that certain body movement and chewing exacerbates the pain. [name] is tolerating her secretions well without drooling. Pt does report some intermittent nausea. She reports taking Tylenol.  Pt denies trismus, dental drainage, fever, chills, diaphoresis, gumline bleeding or erythema, chest pain,  SOB, emesis, visual disturbance, spots in her vision, dizziness, frontal headaches, ear drainage, trouble hearing, congestion, smoking, breastfeeding, or rhinorrhea. Has an appointment with her dentist next week.  No past medical history on file. Past Surgical History  Procedure Laterality Date  . Cesarean section     No family history on file. History  Substance Use Topics  . Smoking status: Never Smoker   . Smokeless tobacco: Not on file  . Alcohol Use: Yes     Comment: occasional    OB History   Grav Para Term Preterm Abortions TAB SAB Ect Mult Living   Review of Systems  Constitutional: Negative for fever, chills and diaphoresis.  HENT: Positive for dental problem and ear pain (radiating from L tooth). Negative for congestion, drooling, ear discharge, facial swelling, hearing loss, mouth sores, rhinorrhea, sinus pressure, sore throat, tinnitus and trouble swallowing.   Eyes: Negative for pain, discharge and visual disturbance.  Respiratory: Negative for cough, shortness of breath, wheezing and stridor.   Cardiovascular: Negative for chest pain.  Gastrointestinal: Negative for nausea, vomiting and abdominal pain.  Musculoskeletal: Negative for arthralgias, myalgias, neck pain and neck stiffness.  Skin: Negative for color change.  Neurological: Negative for dizziness, weakness, numbness and headaches.  Hematological: Does not bruise/bleed easily.  A complete 10 system review of systems was obtained and all systems are negative except as noted in the HPI and PMH.    Allergies  Review of patient's allergies indicates no known allergies.  Home Medications  Prior to Admission medications   Medication Sig Start Date End Date Taking? Authorizing Provider  acetaminophen (TYLENOL) 500 MG tablet Take 1,000 mg by mouth daily as needed. For pain    Historical Provider, MD   BP 144/86  Pulse 80  Temp(Src) 98.8 F (37.1 C) (Oral)  Resp 16  SpO2  100%  Physical Exam  Nursing note and vitals reviewed. Constitutional: She is oriented to person, place, and time. Vital signs are normal. She appears well-developed and well-nourished. No distress.  Afebrile, nontoxic  HENT:  Head: Normocephalic and atraumatic.  Right Ear: Hearing, tympanic membrane, external ear and ear canal normal. No drainage, swelling or tenderness. No mastoid tenderness. Tympanic membrane is not injected. No middle ear effusion.  Left Ear: Hearing, tympanic membrane, external ear and ear canal normal. No drainage, swelling or tenderness. No mastoid tenderness. Tympanic membrane is not injected.  No middle ear effusion.  Nose: Nose normal. No rhinorrhea.  Mouth/Throat: Uvula is midline, oropharynx is clear and moist and mucous membranes are normal. No trismus in the jaw. Abnormal dentition. Dental abscesses and dental caries present. No uvula swelling.    Ears clear bilaterally without TM injection or bulging. Nose clear. Poor oral dentitia throughout with decay in LL molars #17-19, TTP, minimal erythema surrounding gumline without drainage. Oropharynx clear and moist without exudate, swelling, erythema, or PTA. No trismus, uvular swelling or deviation. MMM  Eyes: Conjunctivae and EOM are normal.  Normal appearance  Neck: Normal range of motion. Neck supple. No spinous process tenderness and no muscular tenderness present. No rigidity. Normal range of motion present.  Cardiovascular: Normal rate.   Pulmonary/Chest: Effort normal. No respiratory distress.  Abdominal: Normal appearance. She exhibits no distension.  Musculoskeletal: Normal range of motion.  Lymphadenopathy:       Head (right side): No submental, no submandibular and no tonsillar adenopathy present.       Head (left side): Submandibular adenopathy present. No submental and no tonsillar adenopathy present.    She has cervical adenopathy.       Right cervical: No superficial cervical adenopathy present.       Left cervical: Superficial cervical adenopathy present.  Submandibular and superficial cervical LAD on L side, TTP, mobile and <1cm  Neurological: She is alert and oriented to person, place, and time. She has normal strength. No sensory deficit.  CN 5 and 7 grossly intact  Skin: Skin is warm, dry and intact. No rash noted. No erythema.  No erythema  Psychiatric: She has a normal mood and affect. Her behavior is normal.    ED Course  Procedures (including critical care time)  DIAGNOSTIC STUDIES: Oxygen Saturation is 100% on RA, normal by my interpretation.    COORDINATION OF CARE: 7:31 PM- Advised pt to follow up with a dentist. Pt states that she has a dentist appointment next week. Will prescribe pain medication and antiinflammatories. Will also prescribe antibiotics. Advised pt to return to the ED if her symptoms worsen.  Pt advised of plan for treatment and pt agrees.  Labs Review Labs Reviewed - No data to display  Imaging Review No results found.   EKG Interpretation None      MDM   Final diagnoses:  Dental decay  Dental abscess    28y/o female with dental pain. Dental pain associated with dental infection and possible dental abscess with patient afebrile, non toxic appearing and swallowing secretions well. I gave patient referral to dentist and stressed the importance of dental follow up for  ultimate management of dental pain.  I have also discussed reasons to return immediately to the ER.  Patient expresses understanding and agrees with plan.  I will also give doxycycline and pain control.    I personally performed the services described in this documentation, which was scribed in my presence. The recorded information has been reviewed and is accurate.  BP 144/86  Pulse 80  Temp(Src) 98.8 F (37.1 C) (Oral)  Resp 16  SpO2 100%  Meds ordered this encounter  Medications  . naproxen (NAPROSYN) 500 MG tablet    Sig: Take 1 tablet (500 mg total) by mouth 2 (two)  times daily as needed for mild pain, moderate pain or headache (TAKE WITH MEALS.).    Dispense:  20 tablet    Refill:  0    Order Specific Question:  Supervising Provider    Eber HongILLER, BRIAN D [[id number]]  . HYDROcodone-acetaminophen (NORCO) 5-325 MG per tablet    Sig: Take 1-2 tablets by mouth every 6 (six) hours as needed for severe pain.    Dispense:  15 tablet    Refill:  0    Order Specific Question:  Supervising Provider    Answer:  Eber Hong D [[id number]]  . doxycycline (VIBRAMYCIN) 100 MG capsule    Sig: Take 1 capsule (100 mg total) by mouth 2 (two) times daily. One po bid x 7 days    Dispense:  14 capsule    Refill:  0    Order Specific Question:  Supervising Provider    Answer:  Eber Hong D [[id number]]      [place] Camprubi-Soms, PA-C 02/26/14 2004

## 2014-02-26 NOTE — ED Notes (Signed)
Pt c/o left sided mouth pain radiating up to left temple and down to left throat.

## 2014-02-26 NOTE — Discharge Instructions (Signed)
Apply warm compresses to jaw throughout the day. Take antibiotic until finished and stay out of the sun. Take naprosyn and norco as directed, as needed for pain but do not drive or operate machinery with pain medication use. Followup with a dentist is very important for ongoing evaluation and management of recurrent dental pain. Return to emergency department for emergent changing or worsening symptoms.   Abscessed Tooth An abscessed tooth is an infection around your tooth. It may be caused by holes or damage to the tooth (cavity) or a dental disease. An abscessed tooth causes mild to very bad pain in and around the tooth. See your dentist right away if you have tooth or gum pain. HOME CARE  Take your medicine as told. Finish it even if you start to feel better.  Do not drive after taking pain medicine.  Rinse your mouth (gargle) often with salt water ( teaspoon salt in 8 ounces of warm water).  Do not apply heat to the outside of your face. GET HELP RIGHT AWAY IF:   You have a temperature by mouth above 102 F (38.9 C), not controlled by medicine.  You have chills and a very bad headache.  You have problems breathing or swallowing.  Your mouth will not open.  You develop puffiness (swelling) on the neck or around the eye.  Your pain is not helped by medicine.  Your pain is getting worse instead of better. MAKE SURE YOU:   Understand these instructions.  Will watch your condition.  Will get help right away if you are not doing well or get worse. Document Released: 11/30/2007 Document Revised: 09/05/2011 Document Reviewed: 09/21/2010 [facility] Patient Information 2015 [place], [place]. This information is not intended to replace advice given to you by your health care provider. Make sure you discuss any questions you have with your health care provider.  Dental Caries Dental caries is tooth decay. This decay can cause a hole in teeth (cavity) that can get bigger and deeper over  time. HOME CARE  Brush and floss your teeth. Do this at least two times a day.  Use a fluoride toothpaste.  Use a mouth rinse if told by your dentist or doctor.  Eat less sugary and starchy foods. Drink less sugary drinks.  Avoid snacking often on sugary and starchy foods. Avoid sipping often on sugary drinks.  Keep regular checkups and cleanings with your dentist.  Use fluoride supplements if told by your dentist or doctor.  Allow fluoride to be applied to teeth if told by your dentist or doctor. Document Released: 03/22/2008 Document Revised: 10/28/2013 Document Reviewed: 06/15/2012 [facility] Patient Information 2015 [place], [place]. This information is not intended to replace advice given to you by your health care provider. Make sure you discuss any questions you have with your health care provider.  Dental Care and Dentist Visits Dental care supports good overall health. Regular dental visits can also help you avoid dental pain, bleeding, infection, and other more serious health problems in the future. It is important to keep the mouth healthy because diseases in the teeth, gums, and other oral tissues can spread to other areas of the body. Some problems, such as diabetes, heart disease, and pre-term labor have been associated with poor oral health.  See your dentist every 6 months. If you experience emergency problems such as a toothache or broken tooth, go to the dentist right away. If you see your dentist regularly, you may catch problems early. It is easier to be  treated for problems in the early stages.  WHAT TO EXPECT AT A DENTIST VISIT  Your dentist will look for many common oral health problems and recommend proper treatment. At your regular dental visit, you can expect:  Gentle cleaning of the teeth and gums. This includes scraping and polishing. This helps to remove the sticky substance around the teeth and gums (plaque). Plaque forms in the mouth shortly after eating. Over  time, plaque hardens on the teeth as tartar. If tartar is not removed regularly, it can cause problems. Cleaning also helps remove stains.  Periodic X-rays. These pictures of the teeth and supporting bone will help your dentist assess the health of your teeth.  Periodic fluoride treatments. Fluoride is a natural mineral shown to help strengthen teeth. Fluoride treatmentinvolves applying a fluoride gel or varnish to the teeth. It is most commonly done in children.  Examination of the mouth, tongue, jaws, teeth, and gums to look for any oral health problems, such as:  Cavities (dental caries). This is decay on the tooth caused by plaque, sugar, and acid in the mouth. It is best to catch a cavity when it is small.  Inflammation of the gums caused by plaque buildup (gingivitis).  Problems with the mouth or malformed or misaligned teeth.  Oral cancer or other diseases of the soft tissues or jaws. KEEP YOUR TEETH AND GUMS HEALTHY For healthy teeth and gums, follow these general guidelines as well as your dentist's specific advice:  Have your teeth professionally cleaned at the dentist every 6 months.  Brush twice daily with a fluoride toothpaste.  Floss your teeth daily.  Ask your dentist if you need fluoride supplements, treatments, or fluoride toothpaste.  Eat a healthy diet. Reduce foods and drinks with added sugar.  Avoid smoking. TREATMENT FOR ORAL HEALTH PROBLEMS If you have oral health problems, treatment varies depending on the conditions present in your teeth and gums.  Your caregiver will most likely recommend good oral hygiene at each visit.  For cavities, gingivitis, or other oral health disease, your caregiver will perform a procedure to treat the problem. This is typically done at a separate appointment. Sometimes your caregiver will refer you to another dental specialist for specific tooth problems or for surgery. SEEK IMMEDIATE DENTAL CARE IF:  You have pain,  bleeding, or soreness in the gum, tooth, jaw, or mouth area.  A permanent tooth becomes loose or separated from the gum socket.  You experience a blow or injury to the mouth or jaw area. Document Released: 02/23/2011 Document Revised: 09/05/2011 Document Reviewed: 02/23/2011 [facility] Patient Information 2015 [place], [place]. This information is not intended to replace advice given to you by your health care provider. Make sure you discuss any questions you have with your health care provider.

## 2014-02-27 NOTE — ED Provider Notes (Signed)
Medical screening examination/treatment/procedure(s) were performed by non-physician practitioner and as supervising physician I was immediately available for consultation/collaboration.   EKG Interpretation None        Khush Pasion, MD 02/27/14 0040 

## 2014-03-24 ENCOUNTER — Other Ambulatory Visit: Payer: Self-pay | Admitting: *Deleted

## 2014-04-28 ENCOUNTER — Encounter (HOSPITAL_COMMUNITY): Payer: Self-pay | Admitting: *Deleted

## 2014-08-06 ENCOUNTER — Emergency Department (HOSPITAL_BASED_OUTPATIENT_CLINIC_OR_DEPARTMENT_OTHER): Payer: Self-pay

## 2014-08-06 ENCOUNTER — Emergency Department (HOSPITAL_BASED_OUTPATIENT_CLINIC_OR_DEPARTMENT_OTHER)
Admission: EM | Admit: 2014-08-06 | Discharge: 2014-08-06 | Disposition: A | Discharge status: Home / Self Care | Payer: Self-pay | Attending: Emergency Medicine | Admitting: Emergency Medicine

## 2014-08-06 ENCOUNTER — Encounter (HOSPITAL_BASED_OUTPATIENT_CLINIC_OR_DEPARTMENT_OTHER): Payer: Self-pay | Admitting: *Deleted

## 2014-08-06 DIAGNOSIS — Z792 Long term (current) use of antibiotics: Secondary | ICD-10-CM | POA: Insufficient documentation

## 2014-08-06 DIAGNOSIS — R1031 Right lower quadrant pain: Secondary | ICD-10-CM

## 2014-08-06 DIAGNOSIS — N898 Other specified noninflammatory disorders of vagina: Secondary | ICD-10-CM | POA: Insufficient documentation

## 2014-08-06 DIAGNOSIS — K5900 Constipation, unspecified: Secondary | ICD-10-CM | POA: Insufficient documentation

## 2014-08-06 DIAGNOSIS — Z3202 Encounter for pregnancy test, result negative: Secondary | ICD-10-CM | POA: Insufficient documentation

## 2014-08-06 LAB — CBC WITH DIFFERENTIAL/PLATELET
Basophils Absolute: 0 10*3/uL (ref 0.0–0.1)
Basophils Relative: 0 % (ref 0–1)
EOS PCT: 2 % (ref 0–5)
Eosinophils Absolute: 0.1 10*3/uL (ref 0.0–0.7)
HCT: 39.3 % (ref 36.0–46.0)
Hemoglobin: 12.8 g/dL (ref 12.0–15.0)
Lymphocytes Relative: 39 % (ref 12–46)
Lymphs Abs: 2.1 10*3/uL (ref 0.7–4.0)
MCH: 27.4 pg (ref 26.0–34.0)
MCHC: 32.6 g/dL (ref 30.0–36.0)
MCV: 84.2 fL (ref 78.0–100.0)
MONO ABS: 0.3 10*3/uL (ref 0.1–1.0)
Monocytes Relative: 5 % (ref 3–12)
NEUTROS PCT: 54 % (ref 43–77)
Neutro Abs: 3 10*3/uL (ref 1.7–7.7)
PLATELETS: 198 10*3/uL (ref 150–400)
RBC: 4.67 MIL/uL (ref 3.87–5.11)
RDW: 12.5 % (ref 11.5–15.5)
WBC: 5.5 10*3/uL (ref 4.0–10.5)

## 2014-08-06 LAB — COMPREHENSIVE METABOLIC PANEL
ALBUMIN: 3.9 g/dL (ref 3.5–5.2)
ALT: 12 U/L (ref 0–35)
ANION GAP: 3 — AB (ref 5–15)
AST: 18 U/L (ref 0–37)
Alkaline Phosphatase: 76 U/L (ref 39–117)
BUN: 10 mg/dL (ref 6–23)
CALCIUM: 8.5 mg/dL (ref 8.4–10.5)
CO2: 26 mmol/L (ref 19–32)
CREATININE: 0.84 mg/dL (ref 0.50–1.10)
Chloride: 105 mmol/L (ref 96–112)
GFR calc Af Amer: >90 mL/min (ref >90)
GFR calc non Af Amer: >90 mL/min (ref >90)
Glucose, Bld: 116 mg/dL — ABNORMAL HIGH (ref 70–99)
Potassium: 3.3 mmol/L — ABNORMAL LOW (ref 3.5–5.1)
SODIUM: 134 mmol/L — AB (ref 135–145)
TOTAL PROTEIN: 7.8 g/dL (ref 6.0–8.3)
Total Bilirubin: 0.4 mg/dL (ref 0.3–1.2)

## 2014-08-06 LAB — URINALYSIS, ROUTINE W REFLEX MICROSCOPIC
BILIRUBIN URINE: NEGATIVE
Glucose, UA: NEGATIVE mg/dL
Ketones, ur: NEGATIVE mg/dL
Leukocytes, UA: NEGATIVE
NITRITE: NEGATIVE
Protein, ur: NEGATIVE mg/dL
SPECIFIC GRAVITY, URINE: 1.012 (ref 1.005–1.030)
UROBILINOGEN UA: 0.2 mg/dL (ref 0.0–1.0)
pH: 6 (ref 5.0–8.0)

## 2014-08-06 LAB — URINE MICROSCOPIC-ADD ON

## 2014-08-06 LAB — WET PREP, GENITAL
Trich, Wet Prep: NONE SEEN
Yeast Wet Prep HPF POC: NONE SEEN

## 2014-08-06 LAB — PREGNANCY, URINE: Preg Test, Ur: NEGATIVE

## 2014-08-06 LAB — LIPASE, BLOOD: Lipase: 32 U/L (ref 11–59)

## 2014-08-06 MED ORDER — IOHEXOL 300 MG/ML  SOLN
50.0000 mL | Freq: Once | INTRAMUSCULAR | Status: AC | PRN
  Administered 2014-08-06: 50 mL via ORAL

## 2014-08-06 MED ORDER — POLYETHYLENE GLYCOL 3350 17 G PO PACK: 17.0000 g | PACK | Freq: Every day | ORAL | Status: DC

## 2014-08-06 MED ORDER — IOHEXOL 300 MG/ML  SOLN
100.0000 mL | Freq: Once | INTRAMUSCULAR | Status: AC | PRN
  Administered 2014-08-06: 100 mL via INTRAVENOUS

## 2014-08-06 NOTE — ED Notes (Signed)
Pt returned from CT °

## 2014-08-06 NOTE — ED Notes (Signed)
Pt c/o Right side abd pain x1 week. Pt also c/o nausea, HA and dizziness. Pt also c/o lower right back pain. Pt sts she has had spotting the last couple of days also.

## 2014-08-06 NOTE — Discharge Instructions (Signed)
Abdominal Pain, Women °Abdominal (stomach, pelvic, or belly) pain can be caused by many things. It is important to tell your doctor: °· The location of the pain. °· Does it come and go or is it present all the time? °· Are there things that start the pain (eating certain foods, exercise)? °· Are there other symptoms associated with the pain (fever, nausea, vomiting, diarrhea)? °All of this is helpful to know when trying to find the cause of the pain. °CAUSES  °· Stomach: virus or bacteria infection, or ulcer. °· Intestine: appendicitis (inflamed appendix), regional ileitis (Crohn's disease), ulcerative colitis (inflamed colon), irritable bowel syndrome, diverticulitis (inflamed diverticulum of the colon), or cancer of the stomach or intestine. °· Gallbladder disease or stones in the gallbladder. °· Kidney disease, kidney stones, or infection. °· Pancreas infection or cancer. °· Fibromyalgia (pain disorder). °· Diseases of the female organs: °· Uterus: fibroid (non-cancerous) tumors or infection. °· Fallopian tubes: infection or tubal pregnancy. °· Ovary: cysts or tumors. °· Pelvic adhesions (scar tissue). °· Endometriosis (uterus lining tissue growing in the pelvis and on the pelvic organs). °· Pelvic congestion syndrome (female organs filling up with blood just before the menstrual period). °· Pain with the menstrual period. °· Pain with ovulation (producing an egg). °· Pain with an IUD (intrauterine device, birth control) in the uterus. °· Cancer of the female organs. °· Functional pain (pain not caused by a disease, may improve without treatment). °· Psychological pain. °· Depression. °DIAGNOSIS  °Your doctor will decide the seriousness of your pain by doing an examination. °· Blood tests. °· X-rays. °· Ultrasound. °· CT scan (computed tomography, special type of X-ray). °· MRI (magnetic resonance imaging). °· Cultures, for infection. °· Barium enema (dye inserted in the large intestine, to better view it with  X-rays). °· Colonoscopy (looking in intestine with a lighted tube). °· Laparoscopy (minor surgery, looking in abdomen with a lighted tube). °· Major abdominal exploratory surgery (looking in abdomen with a large incision). °TREATMENT  °The treatment will depend on the cause of the pain.  °· Many cases can be observed and treated at home. °· Over-the-counter medicines recommended by your caregiver. °· Prescription medicine. °· Antibiotics, for infection. °· Birth control pills, for painful periods or for ovulation pain. °· Hormone treatment, for endometriosis. °· Nerve blocking injections. °· Physical therapy. °· Antidepressants. °· Counseling with a psychologist or psychiatrist. °· Minor or major surgery. °HOME CARE INSTRUCTIONS  °· Do not take laxatives, unless directed by your caregiver. °· Take over-the-counter pain medicine only if ordered by your caregiver. Do not take aspirin because it can cause an upset stomach or bleeding. °· Try a clear liquid diet (broth or water) as ordered by your caregiver. Slowly move to a bland diet, as tolerated, if the pain is related to the stomach or intestine. °· Have a thermometer and take your temperature several times a day, and record it. °· Bed rest and sleep, if it helps the pain. °· Avoid sexual intercourse, if it causes pain. °· Avoid stressful situations. °· Keep your follow-up appointments and tests, as your caregiver orders. °· If the pain does not go away with medicine or surgery, you may try: °· Acupuncture. °· Relaxation exercises (yoga, meditation). °· Group therapy. °· Counseling. °SEEK MEDICAL CARE IF:  °· You notice certain foods cause stomach pain. °· Your home care treatment is not helping your pain. °· You need stronger pain medicine. °· You want your IUD removed. °· You feel faint or   lightheaded. °· You develop nausea and vomiting. °· You develop a rash. °· You are having side effects or an allergy to your medicine. °SEEK IMMEDIATE MEDICAL CARE IF:  °· Your  pain does not go away or gets worse. °· You have a fever. °· Your pain is felt only in portions of the abdomen. The right side could possibly be appendicitis. The left lower portion of the abdomen could be colitis or diverticulitis. °· You are passing blood in your stools (bright red or black tarry stools, with or without vomiting). °· You have blood in your urine. °· You develop chills, with or without a fever. °· You pass out. °MAKE SURE YOU:  °· Understand these instructions. °· Will watch your condition. °· Will get help right away if you are not doing well or get worse. °Document Released: 04/10/2007 Document Revised: 10/28/2013 Document Reviewed: 04/30/2009 °ExitCare® Patient Information ©2015 [employer]. This information is not intended to replace advice given to you by your health care provider. Make sure you discuss any questions you have with your health care provider. ° °Constipation °Constipation is when a person has fewer than three bowel movements a week, has difficulty having a bowel movement, or has stools that are dry, hard, or larger than normal. As people grow older, constipation is more common. If you try to fix constipation with medicines that make you have a bowel movement (laxatives), the problem may get worse. Long-term laxative use may cause the muscles of the colon to become weak. A low-fiber diet, not taking in enough fluids, and taking certain medicines may make constipation worse.  °CAUSES  °· Certain medicines, such as antidepressants, pain medicine, iron supplements, antacids, and water pills.   °· Certain diseases, such as diabetes, irritable bowel syndrome (IBS), thyroid disease, or depression.   °· Not drinking enough water.   °· Not eating enough fiber-rich foods.   °· Stress or travel.   °· Lack of physical activity or exercise.   °· Ignoring the urge to have a bowel movement.   °· Using laxatives too much.   °SIGNS AND SYMPTOMS  °· Having fewer than three bowel movements a  week.   °· Straining to have a bowel movement.   °· Having stools that are hard, dry, or larger than normal.   °· Feeling full or bloated.   °· Pain in the lower abdomen.   °· Not feeling relief after having a bowel movement.   °DIAGNOSIS  °Your health care provider will take a medical history and perform a physical exam. Further testing may be done for severe constipation. Some tests may include: °· A barium enema X-ray to examine your rectum, colon, and, sometimes, your small intestine.   °· A sigmoidoscopy to examine your lower colon.   °· A colonoscopy to examine your entire colon. °TREATMENT  °Treatment will depend on the severity of your constipation and what is causing it. Some dietary treatments include drinking more fluids and eating more fiber-rich foods. Lifestyle treatments may include regular exercise. If these diet and lifestyle recommendations do not help, your health care provider may recommend taking over-the-counter laxative medicines to help you have bowel movements. Prescription medicines may be prescribed if over-the-counter medicines do not work.  °HOME CARE INSTRUCTIONS  °· Eat foods that have a lot of fiber, such as fruits, vegetables, whole grains, and beans. °· Limit foods high in fat and processed sugars, such as french fries, hamburgers, cookies, candies, and soda.   °· A fiber supplement may be added to your diet if you cannot get enough fiber from foods.   °·   Drink enough fluids to keep your urine clear or pale yellow.   °· Exercise regularly or as directed by your health care provider.   °· Go to the restroom when you have the urge to go. Do not hold it.   °· Only take over-the-counter or prescription medicines as directed by your health care provider. Do not take other medicines for constipation without talking to your health care provider first.   °SEEK IMMEDIATE MEDICAL CARE IF:  °· You have bright red blood in your stool.   °· Your constipation lasts for more than 4 days or gets  worse.   °· You have abdominal or rectal pain.   °· You have thin, pencil-like stools.   °· You have unexplained weight loss. °MAKE SURE YOU:  °· Understand these instructions. °· Will watch your condition. °· Will get help right away if you are not doing well or get worse. °Document Released: 03/11/2004 Document Revised: 06/18/2013 Document Reviewed: 03/25/2013 °ExitCare® Patient Information ©2015 [employer]. This information is not intended to replace advice given to you by your health care provider. Make sure you discuss any questions you have with your health care provider. ° ° °Emergency Department Resource Guide °1) Find a Doctor and Pay Out of Pocket °Although you won't have to find out who is covered by your insurance plan, it is a good idea to ask around and get recommendations. You will then need to call the office and see if the doctor you have chosen will accept you as a new patient and what types of options they offer for patients who are self-pay. Some doctors offer discounts or will set up payment plans for their patients who do not have insurance, but you will need to ask so you aren't surprised when you get to your appointment. ° °2) Contact Your Local Health Department °Not all health departments have doctors that can see patients for sick visits, but many do, so it is worth a call to see if yours does. If you don't know where your local health department is, you can check in your phone book. The CDC also has a tool to help you locate your state's health department, and many state websites also have listings of all of their local health departments. ° °3) Find a Walk-in Clinic °If your illness is not likely to be very severe or complicated, you may want to try a walk in clinic. These are popping up all over the country in pharmacies, drugstores, and shopping centers. They're usually staffed by nurse practitioners or physician assistants that have been trained to treat common illnesses and  complaints. They're usually fairly quick and inexpensive. However, if you have serious medical issues or chronic medical problems, these are probably not your best option. ° °No Primary Care Doctor: °- Call [facility] at  832-8000 - they can help you locate a primary care doctor that  accepts your insurance, provides certain services, etc. °- Physician Referral Service- 1-800-533-3463 ° °Chronic Pain Problems: °Organization         Address  Phone   Notes  °[facility]  (336) 297-2271 Patients need to be referred by their primary care doctor.  ° °Medication Assistance: °Organization         Address  Phone   Notes  °[place] [facility] [address] °[place], [place] [address] (336) 641-8030 --Must be a resident of [place] °-- Must have NO insurance coverage whatsoever (no Medicaid/ Medicare, etc.) °-- The pt. MUST have   a primary care doctor that directs their care regularly and follows them in the community °  °[facility]  (866) 331-1348   °[employer]  (888) 892-1162   ° °Agencies that provide inexpensive medical care: °Organization         Address  Phone   Notes  °[facility]  (336) 832-8035   °[facility]    (336) 832-7272   °[facility] [address] °[place], [place] [address] (336) 832-4777   °[facility] [address], °[place] (336) 271-4999   °[facility]    (336) 373-0678   °[facility]    (336) 272-1050   °[facility] ° [address], [place] Phone:  (336) 832-4444, Fax:  (336) 832-4440 Hours of Operation:  9 am - 6 pm, M-F.  Also accepts Medicaid/Medicare and self-pay.  °[facility] ° [address], [place] Phone: (336) [phone], Fax: (336) [phone]. Hours of Operation:  8:30 am - 5:30 pm, M-F.  Also accepts Medicaid and self-pay.  °[facility] [place] [address], [place] Phone: (336) 878-6027   °[facility] [address], [place], [place] (336)723-1848, Ext. 123 Mondays & Thursdays: 7-9 AM.  First 15 patients are seen on a first come, first serve basis. °  ° °Medicaid-accepting [place] Providers: ° °Organization         Address  Phone   Notes  °[facility] [address], [place] (336) 641-2100 Also accepts self-pay patients.  °[facility] [address], [place] ° (336) 856-9996   °[facility] [address], [place] (336) 288-8857   °[facility] [address] [place] [address], [place] (336) 299-7000   °Veita Bland [address], [place]  ° (336) 373-1557 Only accepts [place] Access Medicaid patients after they have their name applied to their card.  ° °Self-Pay (no insurance) in [place]: ° °Organization         Address  Phone   Notes  °Sickle Cell Patients, [facility] [address], [place] (336) 832-1970   °[facility] [address], [place] (336) 832-4400   ° [facility] ° [address], [place] (336) 992-4800   °[facility]/Dr. Osei-Bonsu ° [address] [place] [address], [place] or [address], [place] (336) [phone] Phone number for both [place] and [place] locations is the same.  °[facility] [address], [place] (336) 299-0000   °[facility] [address] [place] [address], [place] or [address] (336) 852-7530 °(336) 878-2260   °[facility] [address], [place] (336) 350-1642, phone; (336) 294-5005, fax Sees patients 1st and 3rd Saturday of every month.  Must not qualify for public or private insurance (i.e. Medicaid, Medicare, [place] Health Choice, Veterans' Benefits) • Household income should be no more than 200% of the poverty level  •The clinic cannot treat you if you are pregnant or think you are pregnant • Sexually transmitted diseases are not treated at the clinic.  ° ° °Dental Care: °Organization         Address  Phone  Notes  °[facility] [address],  (  336) 641-6152 Accepts children up to age 21 who are enrolled in Medicaid or [place] Health Choice; pregnant women with a Medicaid card; and children who have applied for Medicaid or [place] Health Choice, but were declined, whose parents can pay a reduced fee at time of service.  °[facility] [place]  [address], [place] (336) 641-7733 Accepts children up to age 21 who are enrolled in Medicaid or [place] Health Choice; pregnant women with a Medicaid card; and children who have applied for Medicaid or [place] Health Choice, but were declined, whose parents can pay a reduced fee at time of service.  °[facility] ° [address], [place] (336) 641-4533 Patients are seen by appointment only. Walk-ins are not accepted. [facility] will see patients 18 years of age and older. °Monday - Tuesday (8am-5pm) °Most Wednesdays (8:30-5pm) °$30 per visit, cash only  °[facility] ° [address], [place] (336) 641-4533 Patients are seen by appointment only. Walk-ins are not accepted. [facility] will see patients 18 years of age and older. °One Wednesday Evening (Monthly: Volunteer Based).  $30 per visit, cash only  °[facility]  (919) [phone] for adults; Children under age 4, call Graduate Pediatric Dentistry at ([phone]) [phone]. Children aged 4-14, please call ([phone]) [phone] to request a pediatric application. ° Dental services are provided in all areas of dental care including fillings, crowns and bridges, complete and partial dentures, implants, gum treatment, root canals, and extractions. Preventive care is  also provided. Treatment is provided to both adults and children. °Patients are selected via a lottery and there is often a waiting list. °  °[facility] [address], °[place] ° (336) 763-8833 [email or web address] °  °[facility] [address], [place], [place] (336)723-1848, Ext. 123 Second and Fourth Thursday of each month, opens at 6:30 AM; Clinic ends at 9 AM.  Patients are seen on a first-come first-served basis, and a limited number are seen during each clinic.  ° °[facility] ° [address], [place], [place] (336) 723-7904   Eligibility Requirements °You must have lived in [place], [place], or [place] counties for at least the last three months. °  You cannot be eligible for state or federal sponsored healthcare insurance, including Veterans Administration, Medicaid, or Medicare. °  You generally cannot be eligible for healthcare insurance through your employer.  °  How to apply: °Eligibility screenings are held every Tuesday and Wednesday afternoon from 1:00 pm until 4:00 pm. You do not need an appointment for the interview!  °[facility] [address], [place], [place] 336-631-2330   °[facility]  336-342-8273   °[facility]  336-703-3100   °[facility]  336-570-6415   ° °Behavioral Health Resources in the Community: °Intensive Outpatient Programs °Organization         Address  Phone  Notes  °[facility] [address], [place], [place] 336-878-6098   °[facility] [address], [place], [place] 336-832-9800   °[facility] [address], [place], [place] ° 336-882-2125   °[facility] [address],  °[place], [place] 1-800-853-5163 or 336-641-4981   °Substance Abuse Resources °Organization         Address  Phone  Notes  °[facility]  336-882-2125   °  [facility]  336-784-9470   °[facility]  336-285-9073   °[facility]  336-845-3988   °Residential & Outpatient Substance Abuse Program  1-800-659-3381   °Psychological Services °Organization         Address  Phone  Notes  °[facility]  336- 832-9600   °[facility]  336- 378-7881   °[facility] [address], [place] 1-800-853-5163 or 336-641-4981   ° °Mobile Crisis Teams °Organization         Address  Phone  Notes  °[facility], Mobile Crisis Care Unit  1-877-626-1772   °Assertive °[facility] ° [address] [place], [place] 336-834-9664   °Sharon DeEsch [address] °[place] 336-554-5454   ° °Self-Help/Support Groups °Organization         Address  Phone             Notes  °[facility] - variety of support groups  336- 373-1402 Call for more information  °Narcotics Anonymous (NA), [facility] [address], °[place]  2 meetings at this location  ° °Residential Treatment Programs °Organization         Address  Phone  Notes  °[facility] [address],    °[place]  1-866-801-8205   °[facility] ° [address], [place], [place] 704-293-8524   °[facility] [address], [place] 336-845-3988 Admissions: 8am-3pm M-F  °[facility] [address]    °[place], [place] 336-841-1104   °[facility] [address], [place], [place] 336-379-7146   °[facility] [address]  °[place], [place] 336-285-9073   °[facility] - Intensive Outpatient [address], [place], [place] 336-852-3033   °[facility] [address]  °[place], [place] 1-877-615-2722 or 336-784-9470   °[facility] [address] [place], [place] 336-227-7417 Accepts Medicaid  °[facility] [address]  °[place] 1-800-659-3381  Substance Abuse/Addiction Treatment  ° °[place] Behavioral Health Resources °Organization         Address  Phone  Notes  °[facility]  (888) 581-9988   °Julie Brannon, PhD [address] [place], [place]   (336) 349-5553 or (336) 951-0000   °[facility]   [address] °[place], [place] (336) 349-4454   °[facility] [address], [place], [place] (336) 342-8316 Insurance/Medicaid/sponsorship through [facility]  °[facility] [address]                                    [place], [place] (336) 342-8316 Therapy/tele-psych/case  °[facility] [address]  ° [place], [place] (336) 349-2233    °Dr. Arfeen  (336) 349-4544   °[facility] [place]  [employer] [place] [facility] 1) [address], [place] °2) [address], [place] °3)  [address], [place] (336) 349-3220 °(336) 342-7768 ° °(336) 342-8140   °[place] [facility] (336) 342-1394 or (336) 342-3537 (After Hours)    ° ° ° °

## 2014-08-06 NOTE — ED Provider Notes (Signed)
CSN: [id number]     Arrival date & time 08/06/14  4098 History   First MD Initiated Contact with Patient 08/06/14 1112     Chief Complaint  Patient presents with  . Abdominal Pain     (Consider location/radiation/quality/duration/timing/severity/associated sxs/prior Treatment) HPI The patient has had right-sided abdominal pain developing gradually for a week. She reports she's had nausea with it. She reports it radiates into her lower back as well. There is been no associated fever. She reports that the pain is not significantly worsened by eating. She denies any history of gallbladder disease. There is been no diarrhea or constipation. She reports that she normally has very irregular periods. She reports that she has had a little spotting for a couple of days. History reviewed. No pertinent past medical history. Past Surgical History  Procedure Laterality Date  . Cesarean section     No family history on file. History  Substance Use Topics  . Smoking status: Never Smoker   . Smokeless tobacco: Not on file  . Alcohol Use: Yes     Comment: occasional    OB History    Gravida Para Term Preterm AB TAB SAB Ectopic Multiple Living   Review of Systems 10 Systems reviewed and are negative for acute change except as noted in the HPI.    Allergies  Review of patient's allergies indicates no known allergies.  Home Medications   Prior to Admission medications   Medication Sig Start Date End Date Taking? Authorizing Provider  acetaminophen (TYLENOL) 500 MG tablet Take 1,000 mg by mouth daily as needed. For pain    Historical Provider, MD  doxycycline (VIBRAMYCIN) 100 MG capsule Take 1 capsule (100 mg total) by mouth 2 (two) times daily. One po bid x 7 days 02/26/14   Donnita Falls Camprubi-Soms, PA-C  HYDROcodone-acetaminophen (NORCO) 5-325 MG per tablet Take 1-2 tablets by mouth every 6 (six) hours as needed for severe pain. 02/26/14   Mercedes Strupp Camprubi-Soms,  PA-C  naproxen (NAPROSYN) 500 MG tablet Take 1 tablet (500 mg total) by mouth 2 (two) times daily as needed for mild pain, moderate pain or headache (TAKE WITH MEALS.). 02/26/14   Donnita Falls Camprubi-Soms, PA-C  polyethylene glycol (MIRALAX / GLYCOLAX) packet Take 17 g by mouth daily. 08/06/14   Arby Barrette, MD   BP 174/95 mmHg  Pulse 92  Temp(Src) 98.7 F (37.1 C) (Oral)  Resp 16  Ht  (1.575 m)  Wt 182 lb (82.555 kg)  BMI 33.28 kg/m2  SpO2 100%  LMP 06/05/2014 Physical Exam  Constitutional: She is oriented to person, place, and time. She appears well-developed and well-nourished.  HENT:  Head: Normocephalic and atraumatic.  Eyes: EOM are normal. Pupils are equal, round, and reactive to light.  Neck: Neck supple.  Cardiovascular: Normal rate, regular rhythm, normal heart sounds and intact distal pulses.   Pulmonary/Chest: Effort normal and breath sounds normal.  Abdominal: Soft. Bowel sounds are normal. She exhibits no distension. There is tenderness.  Right lower quadrant is tender to palpation. There is no guarding or rebound.  Genitourinary: Vagina normal. No vaginal discharge found.  Trace amount of brownish discharge in the vaginal vault. Cervix is nonfriable. Bimanual examination is for no focal mass or tenderness.  Musculoskeletal: Normal range of motion. She exhibits no edema.  Neurological: She is alert and oriented to person, place, and time. She has normal strength. Coordination normal. GCS  eye subscore is 4. GCS verbal subscore is 5. GCS motor subscore is 6.  Skin: Skin is warm, dry and intact.  Psychiatric: She has a normal mood and affect.    ED Course  Procedures (including critical care time) Labs Review Labs Reviewed  WET PREP, GENITAL - Abnormal; Notable for the following:    Clue Cells Wet Prep HPF POC MODERATE (*)    WBC, Wet Prep HPF POC MODERATE (*)    All other components within normal limits  URINALYSIS, ROUTINE W REFLEX MICROSCOPIC -  Abnormal; Notable for the following:    Hgb urine dipstick MODERATE (*)    All other components within normal limits  COMPREHENSIVE METABOLIC PANEL - Abnormal; Notable for the following:    Sodium 134 (*)    Potassium 3.3 (*)    Glucose, Bld 116 (*)    Anion gap 3 (*)    All other components within normal limits  URINE MICROSCOPIC-ADD ON - Abnormal; Notable for the following:    Bacteria, UA FEW (*)    All other components within normal limits  PREGNANCY, URINE  CBC WITH DIFFERENTIAL/PLATELET  LIPASE, BLOOD  GC/CHLAMYDIA PROBE AMP ([facility])    Imaging Review Ct Abdomen Pelvis W Contrast  08/06/2014   CLINICAL DATA:  Right lower quadrant pain for 1 week with nausea.  EXAM: CT ABDOMEN AND PELVIS WITH CONTRAST  TECHNIQUE: Multidetector CT imaging of the abdomen and pelvis was performed using the standard protocol following bolus administration of intravenous contrast.  CONTRAST:  50mL OMNIPAQUE IOHEXOL 300 MG/ML SOLN, 100mL OMNIPAQUE IOHEXOL 300 MG/ML SOLN  COMPARISON:  None.  FINDINGS: Lower chest: Lung bases show no acute findings. Heart size normal. No pericardial or pleural effusion.  Hepatobiliary: Liver and gallbladder are unremarkable. No biliary ductal dilatation.  Pancreas: Negative.  Spleen: Negative.  Adrenals/Urinary Tract: Adrenal glands and kidneys are unremarkable. Ureters are decompressed. Bladder is unremarkable.  Stomach/Bowel: Stomach, small bowel and appendix are unremarkable. A fair amount of stool is seen in the colon.  Vascular/Lymphatic: Vascular structures are unremarkable. No pathologically enlarged lymph nodes.  Reproductive: Uterus and ovaries are visualized.  Other: No free fluid.  Mesenteries and peritoneum are unremarkable.  Musculoskeletal: No worrisome lytic or sclerotic lesions.  IMPRESSION: 1. No evidence of appendicitis. 2. Fair amount of stool in the colon is indicative of constipation. .   Electronically Signed   By: Leanna BattlesMelinda  Blietz M.D.   On: 08/06/2014  12:49     EKG Interpretation None      MDM   Final diagnoses:  Right lower quadrant abdominal pain  Constipation, unspecified constipation type   CT scan rules out patient out for appendicitis. No inflammation is seen in the region of the gallbladder and the patient does not associate the pain with particular food intake. His emanation is not suggestive of PID, cultures will be pending that I did not fill it in. Treatment was indicated at this point time. CT identifies constipation for which the patient will be treated with me relax. She is advised to have a recheck within the next 3-5 days for resolution of symptoms. If her symptoms are worsening or changing she is to return for reassessment.    Arby BarretteMarcy Julianna Vanwagner, MD 08/06/14 [phone]

## 2014-08-06 NOTE — ED Notes (Signed)
MD at bedside. 

## 2014-08-07 LAB — GC/CHLAMYDIA PROBE AMP (~~LOC~~) NOT AT ARMC
CHLAMYDIA, DNA PROBE: NEGATIVE
NEISSERIA GONORRHEA: NEGATIVE

## 2014-12-13 ENCOUNTER — Emergency Department (HOSPITAL_BASED_OUTPATIENT_CLINIC_OR_DEPARTMENT_OTHER)
Admission: EM | Admit: 2014-12-13 | Discharge: 2014-12-14 | Disposition: A | Discharge status: Home / Self Care | Payer: Self-pay | Attending: Emergency Medicine | Admitting: Emergency Medicine

## 2014-12-13 ENCOUNTER — Emergency Department (HOSPITAL_BASED_OUTPATIENT_CLINIC_OR_DEPARTMENT_OTHER): Payer: Self-pay

## 2014-12-13 ENCOUNTER — Encounter (HOSPITAL_BASED_OUTPATIENT_CLINIC_OR_DEPARTMENT_OTHER): Payer: Self-pay | Admitting: Emergency Medicine

## 2014-12-13 DIAGNOSIS — W231XXA Caught, crushed, jammed, or pinched between stationary objects, initial encounter: Secondary | ICD-10-CM | POA: Insufficient documentation

## 2014-12-13 DIAGNOSIS — Y92481 Parking lot as the place of occurrence of the external cause: Secondary | ICD-10-CM | POA: Insufficient documentation

## 2014-12-13 DIAGNOSIS — Y998 Other external cause status: Secondary | ICD-10-CM | POA: Insufficient documentation

## 2014-12-13 DIAGNOSIS — Z79899 Other long term (current) drug therapy: Secondary | ICD-10-CM | POA: Insufficient documentation

## 2014-12-13 DIAGNOSIS — S62633A Displaced fracture of distal phalanx of left middle finger, initial encounter for closed fracture: Secondary | ICD-10-CM

## 2014-12-13 DIAGNOSIS — Z792 Long term (current) use of antibiotics: Secondary | ICD-10-CM | POA: Insufficient documentation

## 2014-12-13 DIAGNOSIS — S62632A Displaced fracture of distal phalanx of right middle finger, initial encounter for closed fracture: Secondary | ICD-10-CM | POA: Insufficient documentation

## 2014-12-13 DIAGNOSIS — S60051A Contusion of right little finger without damage to nail, initial encounter: Secondary | ICD-10-CM | POA: Insufficient documentation

## 2014-12-13 DIAGNOSIS — Y9389 Activity, other specified: Secondary | ICD-10-CM | POA: Insufficient documentation

## 2014-12-13 NOTE — ED Notes (Signed)
Pt got finger smashed between car door and pole in parking lot.  Visible bruising and swelling, along with broken nail noted to 3rd right finger.

## 2014-12-14 NOTE — Discharge Instructions (Signed)
Wear splint for comfort for the next 1-2 weeks.  Ibuprofen 600 mg every 6 hours as needed for pain.   Finger Fracture Fractures of fingers are breaks in the bones of the fingers. There are many types of fractures. There are different ways of treating these fractures. Your health care provider will discuss the best way to treat your fracture. CAUSES Traumatic injury is the main cause of broken fingers. These include:  Injuries while playing sports.  Workplace injuries.  Falls. RISK FACTORS Activities that can increase your risk of finger fractures include:  Sports.  Workplace activities that involve machinery.  A condition called osteoporosis, which can make your bones less dense and cause them to fracture more easily. SIGNS AND SYMPTOMS The main symptoms of a broken finger are pain and swelling within 15 minutes after the injury. Other symptoms include:  Bruising of your finger.  Stiffness of your finger.  Numbness of your finger.  Exposed bones (compound fracture) if the fracture is severe. DIAGNOSIS  The best way to diagnose a broken bone is with X-ray imaging. Additionally, your health care provider will use this X-ray image to evaluate the position of the broken finger bones.  TREATMENT  Finger fractures can be treated with:   Nonreduction--This means the bones are in place. The finger is splinted without changing the positions of the bone pieces. The splint is usually left on for about a week to 10 days. This will depend on your fracture and what your health care provider thinks.  Closed reduction--The bones are put back into position without using surgery. The finger is then splinted.  Open reduction and internal fixation--The fracture site is opened. Then the bone pieces are fixed into place with pins or some type of hardware. This is seldom required. It depends on the severity of the fracture. HOME CARE INSTRUCTIONS   Follow your health care provider's  instructions regarding activities, exercises, and physical therapy.  Only take over-the-counter or prescription medicines for pain, discomfort, or fever as directed by your health care provider. SEEK MEDICAL CARE IF: You have pain or swelling that limits the motion or use of your fingers. SEEK IMMEDIATE MEDICAL CARE IF:  Your finger becomes numb. MAKE SURE YOU:   Understand these instructions.  Will watch your condition.  Will get help right away if you are not doing well or get worse. Document Released: 09/25/2000 Document Revised: 04/03/2013 Document Reviewed: 01/23/2013 [facility] Patient Information 2015 [place], [place]. This information is not intended to replace advice given to you by your health care provider. Make sure you discuss any questions you have with your health care provider.

## 2014-12-14 NOTE — ED Provider Notes (Signed)
CSN: [id number]     Arrival date & time 12/13/14  2055 History  This chart was scribed for Geoffery Lyons, MD by Lyndel Safe, ED Scribe. This patient was seen in room MH10/MH10 and the patient's care was started 12:06 AM.   Chief Complaint  Patient presents with  . Finger Injury   The history is provided by the patient. No language interpreter was used.    HPI Comments: KASHLEE NEVILS is a 29 y.o. female who presents to the Emergency Department complaining of sudden onset, constant, moderate ecchymosis and swelling to 2nd right digit that occurred approximately 5 hours ago. Pt reports associated 6/10 pain to her right middle finger and also notes there was minimal bleeding of finger. Pt reports her finger was crushed in between a car door and parking lot pole. The acrylic nail that is adhered to the right middle finger is cracked.   History reviewed. No pertinent past medical history. Past Surgical History  Procedure Laterality Date  . Cesarean section     No family history on file. History  Substance Use Topics  . Smoking status: Never Smoker   . Smokeless tobacco: Not on file  . Alcohol Use: Yes     Comment: occasional    OB History    Gravida Para Term Preterm AB TAB SAB Ectopic Multiple Living   5 4 4  1   1  4      Review of Systems  Skin: Positive for color change and wound.   A complete 10 system review of systems was obtained and is otherwise negative except at noted in the HPI and PMH.  Allergies  Review of patient's allergies indicates no known allergies.  Home Medications   Prior to Admission medications   Medication Sig Start Date End Date Taking? Authorizing Provider  acetaminophen (TYLENOL) 500 MG tablet Take 1,000 mg by mouth daily as needed. For pain    Historical Provider, MD  doxycycline (VIBRAMYCIN) 100 MG capsule Take 1 capsule (100 mg total) by mouth 2 (two) times daily. One po bid x 7 days 02/26/14   Mercedes Camprubi-Soms, PA-C   HYDROcodone-acetaminophen (NORCO) 5-325 MG per tablet Take 1-2 tablets by mouth every 6 (six) hours as needed for severe pain. 02/26/14   Mercedes Camprubi-Soms, PA-C  naproxen (NAPROSYN) 500 MG tablet Take 1 tablet (500 mg total) by mouth 2 (two) times daily as needed for mild pain, moderate pain or headache (TAKE WITH MEALS.). 02/26/14   Mercedes Camprubi-Soms, PA-C  polyethylene glycol (MIRALAX / GLYCOLAX) packet Take 17 g by mouth daily. 08/06/14   Arby Barrette, MD   BP 138/73 mmHg  Pulse 78  Temp(Src) 98.4 F (36.9 C) (Oral)  Ht 5\' 1"  (1.549 m)  Wt 185 lb (83.915 kg)  BMI 34.97 kg/m2  SpO2 100%  LMP 11/04/2014 (Approximate) Physical Exam  Constitutional: She is oriented to person, place, and time. She appears well-developed and well-nourished. No distress.  HENT:  Head: Normocephalic.  Right Ear: External ear normal.  Left Ear: External ear normal.  Mouth/Throat: No oropharyngeal exudate.  Eyes: Pupils are equal, round, and reactive to light. Right eye exhibits no discharge. Left eye exhibits no discharge. No scleral icterus.  Neck: No JVD present.  Cardiovascular: Normal rate, regular rhythm and normal heart sounds.   Pulmonary/Chest: Effort normal and breath sounds normal. No respiratory distress.  Musculoskeletal: She exhibits no edema.  Right middle finger has swelling and ecchymosis to the finger pad. There is an acrylic nail in  place with a crack and a small amount of bleeding coming from under the nail. The nail itself is firmly in place.  Lymphadenopathy:    She has no cervical adenopathy.  Neurological: She is alert and oriented to person, place, and time.  Skin: Skin is warm. No rash noted. No erythema. No pallor.  Psychiatric: She has a normal mood and affect. Her behavior is normal.  Nursing note and vitals reviewed.   ED Course  Procedures  DIAGNOSTIC STUDIES: Oxygen Saturation is 100% on RA, normal by my interpretation.    COORDINATION OF CARE: 12:10 AM  Discussed treatment plan which includes to apply a finger splint to right middle finger. Pt acknowledges and agrees to plan.   Labs Review Labs Reviewed - No data to display  Imaging Review Dg Finger Middle Right  12/13/2014   CLINICAL DATA:  Finger caught between car door and light pole  EXAM: RIGHT THIRD FINGER 2+V  COMPARISON:  None.  FINDINGS: Frontal, oblique, and lateral views obtained. There is a fracture of the distal aspect of the third distal phalanx, mildly displaced. No other fractures. No dislocation. Joint spaces appear intact.  IMPRESSION: Fracture distal aspect of third distal phalanx with mild displacement of fracture fragments.   Electronically Signed   By: Bretta Bang III M.D.   On: 12/13/2014 21:38     EKG Interpretation None      MDM   Final diagnoses:  None    X-ray reveals a tuft fracture of the third phalanx. This will be treated with a splint, ice, and when necessary follow-up.  I personally performed the services described in this documentation, which was scribed in my presence. The recorded information has been reviewed and is accurate.      Geoffery Lyons, MD 12/14/14 [phone]

## 2016-03-02 IMAGING — CT CT ABD-PELV W/ CM
2 of 4 series · 17 of 46 positions shown, 19 images · IV contrast (APPLIED)
Comparison: None.

CLINICAL DATA: Right lower quadrant pain for 1 week with nausea.

EXAM:
CT ABDOMEN AND PELVIS WITH CONTRAST
TECHNIQUE: Multidetector CT imaging of the abdomen and pelvis was performed
using the standard protocol following bolus administration of
intravenous contrast.
CONTRAST:  50mL OMNIPAQUE IOHEXOL 300 MG/ML SOLN, 100mL OMNIPAQUE
IOHEXOL 300 MG/ML SOLN

[Series 2: abd/pelvis 5.0 b31f · axial · 0.83mm/px · z∈[-700,-280]mm · 14 of 92 slices shown, 16 images]
[im 4/92  soft-tissue]
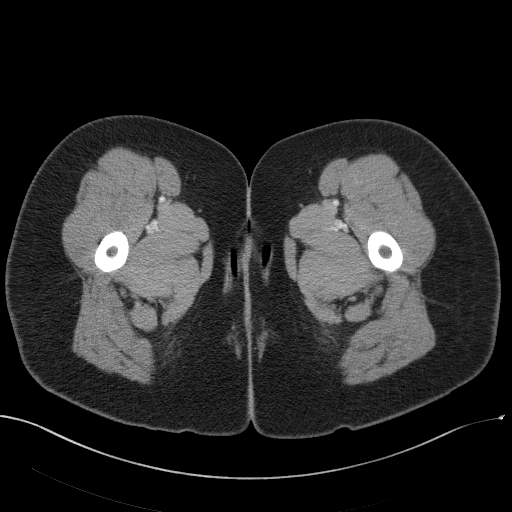
[im 4/92  bone]
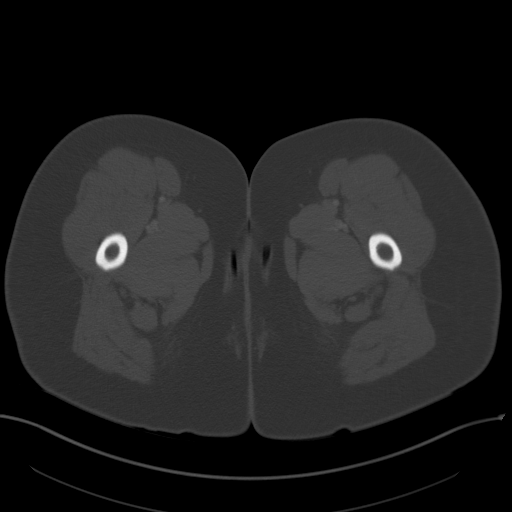
[im 11/92  soft-tissue]
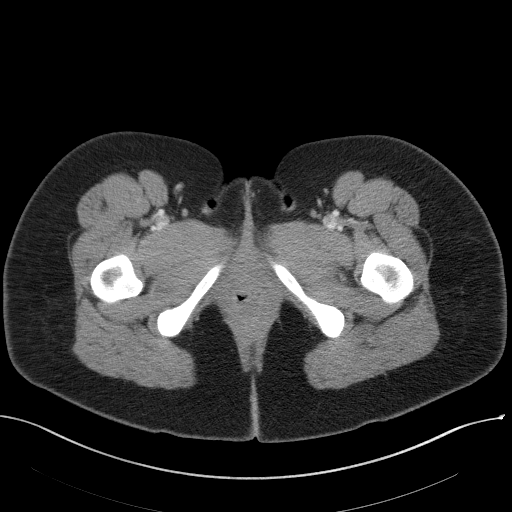
[im 18/92  soft-tissue]
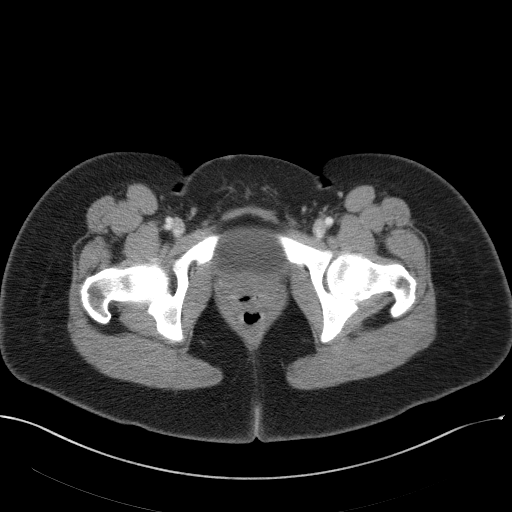
[im 25/92  soft-tissue]
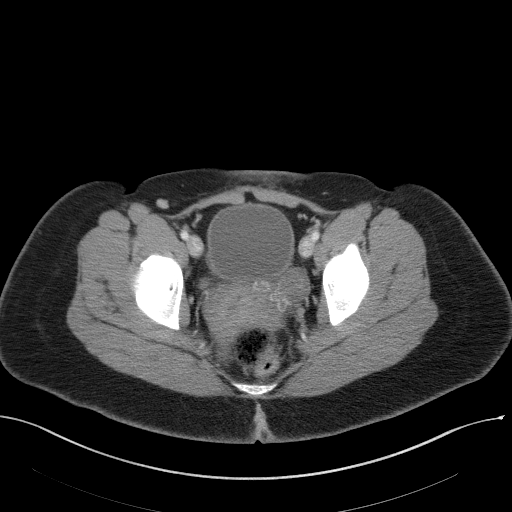
[im 32/92  soft-tissue]
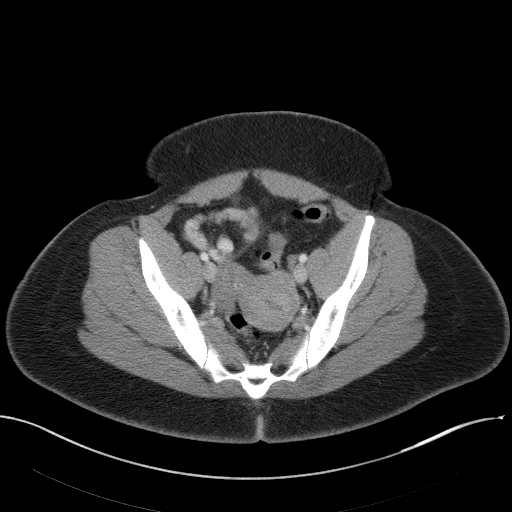
[im 36/92  soft-tissue]
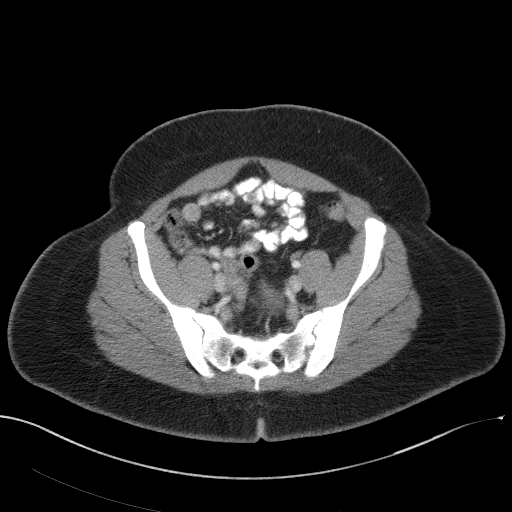
[im 43/92  soft-tissue]
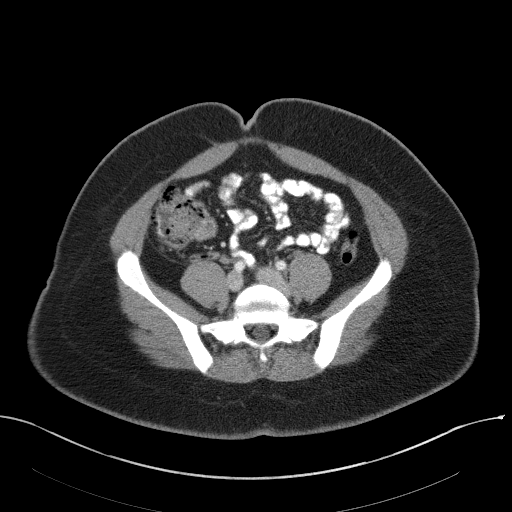
[im 50/92  soft-tissue]
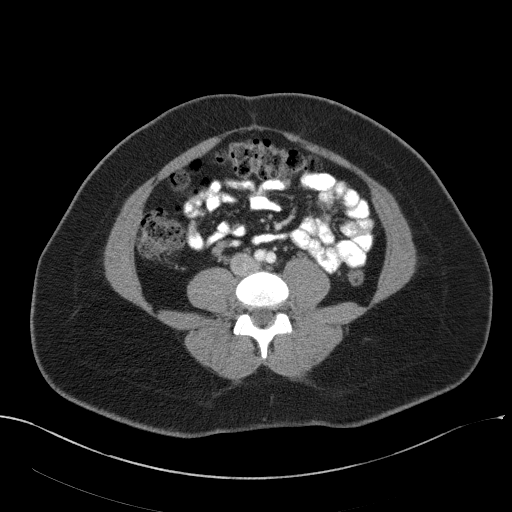
[im 57/92  soft-tissue]
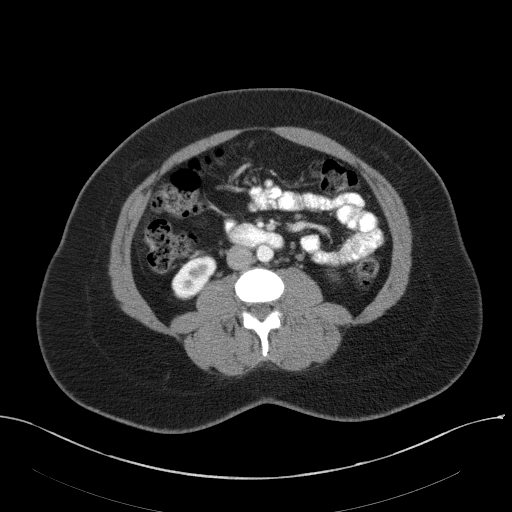
[im 57/92  bone]
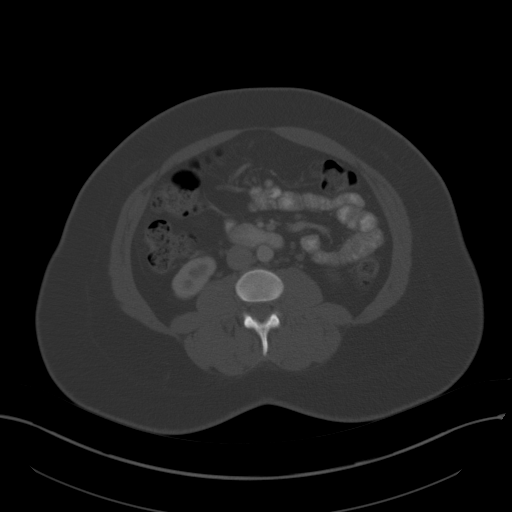
[im 60/92  soft-tissue]
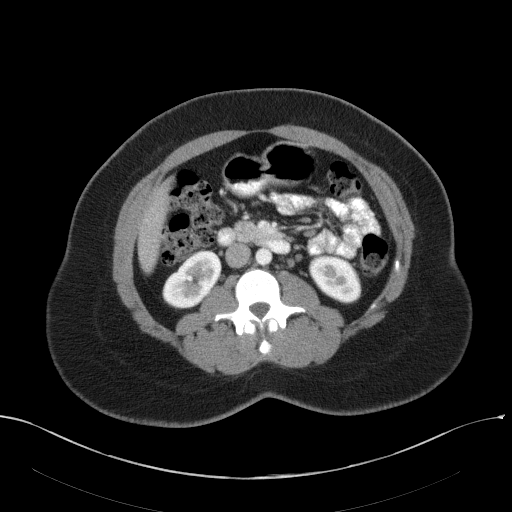
[im 67/92  soft-tissue]
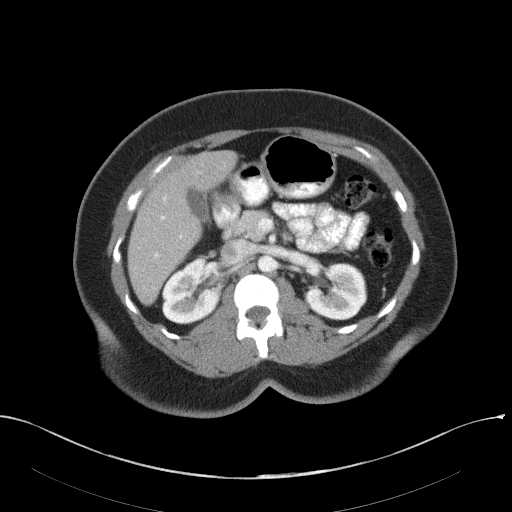
[im 74/92  soft-tissue]
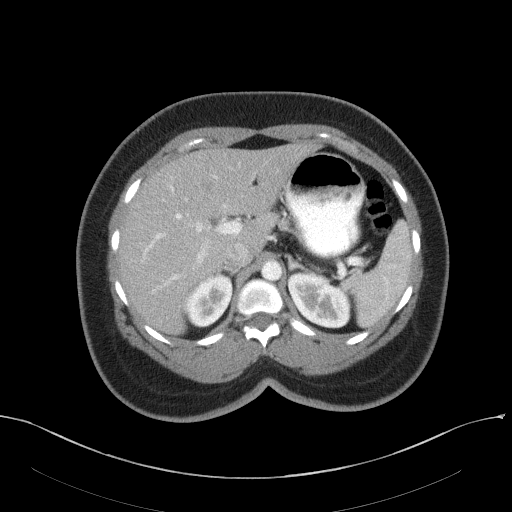
[im 81/92  soft-tissue]
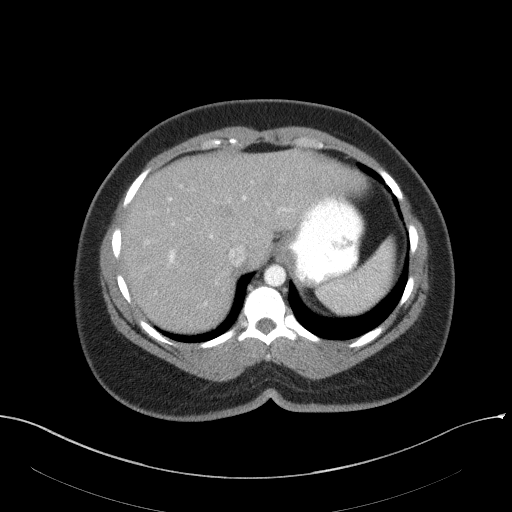
[im 88/92  soft-tissue]
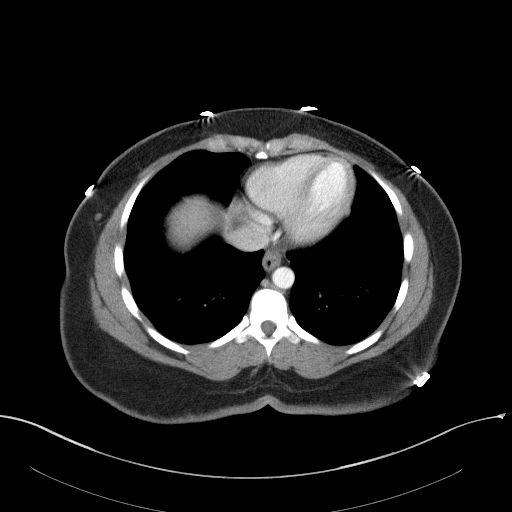

[Series 5: abd/pelvis 3.0 coronal · coronal · 0.85mm/px · 3 of 98 slices shown]
[im 33/98  soft-tissue]
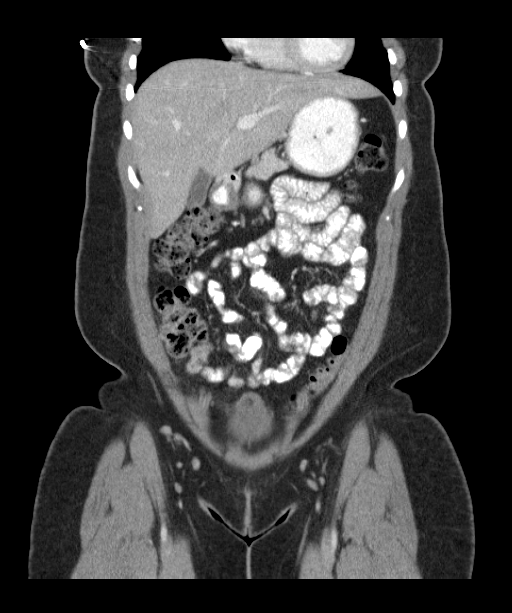
[im 44/98  soft-tissue]
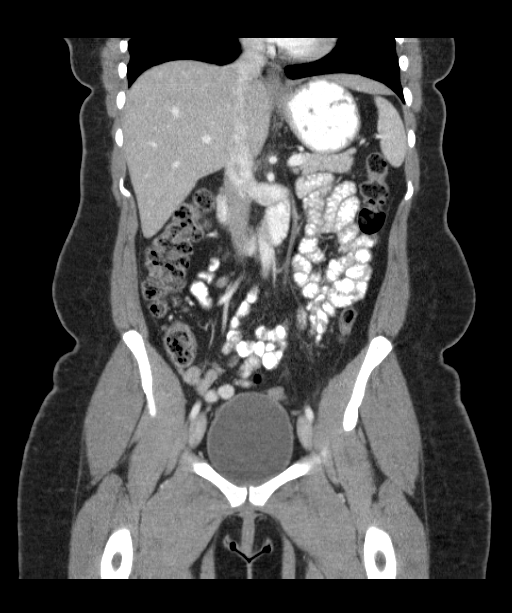
[im 54/98  soft-tissue]
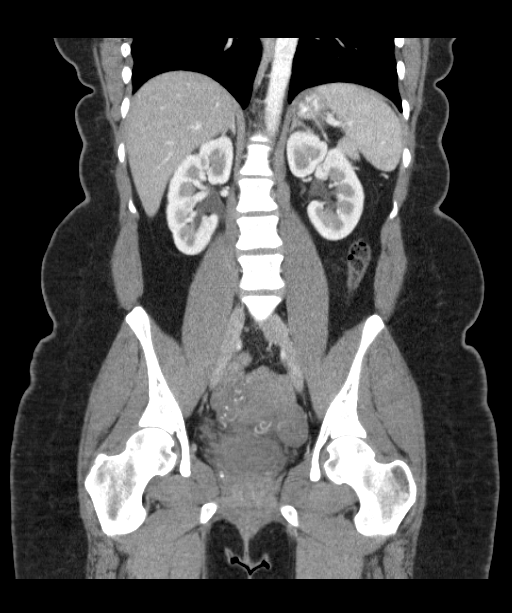

[17 of 46 positions shown; findings below may reference images not displayed]

FINDINGS: Lower chest: Lung bases show no acute findings. Heart size normal.
No pericardial or pleural effusion.

Hepatobiliary: Liver and gallbladder are unremarkable. No biliary
ductal dilatation.

Pancreas: Negative.

Spleen: Negative.

Adrenals/Urinary Tract: Adrenal glands and kidneys are unremarkable.
Ureters are decompressed. Bladder is unremarkable.

Stomach/Bowel: Stomach, small bowel and appendix are unremarkable. A
fair amount of stool is seen in the colon.

Vascular/Lymphatic: Vascular structures are unremarkable. No
pathologically enlarged lymph nodes.

Reproductive: Uterus and ovaries are visualized.

Other: No free fluid.  Mesenteries and peritoneum are unremarkable.

Musculoskeletal: No worrisome lytic or sclerotic lesions.
IMPRESSION: 1. No evidence of appendicitis.
2. Fair amount of stool in the colon is indicative of constipation.
.

## 2018-03-21 ENCOUNTER — Encounter: Payer: Self-pay | Admitting: Gastroenterology

## 2018-03-21 NOTE — Progress Notes (Signed)
Received an inquiry that this patient is interested in becoming a new patient at [facility].  Attempted to contact the patient and left a voicemail for patient to return our call if they're still interested in becoming a patient.

## 2018-05-14 ENCOUNTER — Ambulatory Visit: Payer: Medicaid Other | Attending: Internal Medicine | Admitting: Family Medicine

## 2018-05-14 ENCOUNTER — Encounter: Payer: Self-pay | Admitting: Family Medicine

## 2018-05-14 ENCOUNTER — Other Ambulatory Visit
Admission: RE | Admit: 2018-05-14 | Discharge: 2018-05-14 | Disposition: A | Discharge status: Home / Self Care | Payer: Medicaid Other | Source: Ambulatory Visit | Attending: Internal Medicine | Admitting: Internal Medicine

## 2018-05-14 VITALS — BP 139/89 | HR 87 | Temp 96.4°F | Ht 63.0 in | Wt 184.0 lb

## 2018-05-14 DIAGNOSIS — R030 Elevated blood-pressure reading, without diagnosis of hypertension: Secondary | ICD-10-CM | POA: Insufficient documentation

## 2018-05-14 DIAGNOSIS — Z0000 Encounter for general adult medical examination without abnormal findings: Secondary | ICD-10-CM | POA: Insufficient documentation

## 2018-05-14 DIAGNOSIS — Z833 Family history of diabetes mellitus: Secondary | ICD-10-CM | POA: Insufficient documentation

## 2018-05-14 DIAGNOSIS — K5909 Other constipation: Secondary | ICD-10-CM | POA: Insufficient documentation

## 2018-05-14 DIAGNOSIS — Z800 Family history of malignant neoplasm of digestive organs: Secondary | ICD-10-CM | POA: Insufficient documentation

## 2018-05-14 DIAGNOSIS — Z7251 High risk heterosexual behavior: Secondary | ICD-10-CM | POA: Insufficient documentation

## 2018-05-14 DIAGNOSIS — Z7689 Persons encountering health services in other specified circumstances: Secondary | ICD-10-CM | POA: Insufficient documentation

## 2018-05-14 DIAGNOSIS — Z23 Encounter for immunization: Secondary | ICD-10-CM | POA: Insufficient documentation

## 2018-05-14 DIAGNOSIS — E669 Obesity, unspecified: Secondary | ICD-10-CM | POA: Insufficient documentation

## 2018-05-14 DIAGNOSIS — Z975 Presence of (intrauterine) contraceptive device: Secondary | ICD-10-CM | POA: Insufficient documentation

## 2018-05-14 DIAGNOSIS — Z8742 Personal history of other diseases of the female genital tract: Secondary | ICD-10-CM

## 2018-05-14 LAB — BASIC METABOLIC PANEL
Anion Gap: 12 (ref 7–16)
CO2: 25 mmol/L (ref 20–28)
Calcium: 9.2 mg/dL (ref 8.8–10.2)
Chloride: 105 mmol/L (ref 96–108)
Creatinine: 1.01 mg/dL — ABNORMAL HIGH (ref 0.51–0.95)
GFR,Black: 85 *
GFR,Caucasian: 73 *
Glucose: 102 mg/dL — ABNORMAL HIGH (ref 60–99)
Lab: 10 mg/dL (ref 6–20)
Potassium: 4.5 mmol/L (ref 3.3–5.1)
Sodium: 142 mmol/L (ref 133–145)

## 2018-05-14 LAB — SYPHILIS SCREEN
Syphilis Screen: NEGATIVE
Syphilis Status: NONREACTIVE

## 2018-05-14 LAB — LIPID PANEL
Chol/HDL Ratio: 3.2
Cholesterol: 135 mg/dL
HDL: 42 mg/dL (ref 40–60)
LDL Calculated: 80 mg/dL
Non HDL Cholesterol: 93 mg/dL
Triglycerides: 65 mg/dL

## 2018-05-14 LAB — TRICHOMONAS DNA AMPLIFICATION: Trichomonas DNA amplification: 0

## 2018-05-14 LAB — CHLAMYDIA PLASMID DNA AMPLIFICATION: Chlamydia Plasmid DNA Amplification: 0

## 2018-05-14 LAB — HIV 1&2 ANTIGEN/ANTIBODY: HIV 1&2 ANTIGEN/ANTIBODY: NONREACTIVE

## 2018-05-14 LAB — HEMOGLOBIN A1C: Hemoglobin A1C: 5.1 %

## 2018-05-14 LAB — N. GONORRHOEAE DNA AMPLIFICATION: N. gonorrhoeae DNA Amplification: 0

## 2018-05-14 MED ORDER — POLYETHYLENE GLYCOL 3350 PO POWD *I*
17.0000 g | Freq: Every day | ORAL | 2 refills | Status: AC
Start: 2018-05-14 — End: ?

## 2018-05-14 NOTE — Progress Notes (Signed)
[place] - Annual Comprehensive Physical Exam    Genesi is a 32 y.o. female who presents today for an annual check-up.    We also discussed the following issues today:       Diet & exercise  Breakfast: never eats  Lunch: canned soup, sandwich, leftovers  Dinner: same  Beverages: hot tea, with a lot of sugar  Drinks: Pop -- at least 1 can a day   Occasional fast food   No exercise -- used to walk  [facility]   Interest in weight management group  No daily meds except nexplanon, placed Feb 2019     Chronic Constipation  - always, as long as she can remember  - doesn't go every day   - strains every time she goes      Elevated BPs   - every time she tries to give blood she is turned away bc BP is high  - bottom number is usually in the 90s     Health Maintenance  women  Diabetes screening (>45y or <45y with RFs): ordered  Lipid screening (>45y or >35y with HTN, smoker, FamHx. Repeat q5y if low, q3 if borderline): ordered  PAP smear: (cytology every 3 for 21-29, cytology and HPV every 5 for 30-65)   Last PAP and results: ASCUS 09/2013   Next due: WILL SCHEDULE  Colon cancer (age 30-75, FOBT Q year, FOBT Q3 with Fsig Q5, Cspy Q10): undecided   Mammogram (every two years, ages 33-75, earlier if FamHx): not indicated  Lung Cancer (annual low-dose CT for 55-80yo with 30 pack-year hx): not indicated  DEXA scan (>65 or post-menopausal and smoker, EtOH, <127lbs, RA, steroids): not indicated  GC/CT (age 32-25 and high-risk): ordered  HIV (universal): ordered  Hep C (IVDU, HCV Ab if born [birth year]-1965): not indicated  Up to date on Tetanus? Not indicated   Eligible for Pneumovax or Prevnar? Not indicated     [place]  Patient Active Problem List   Diagnosis Code    Obese E66.9    Family hx of colon cancer Z80.0    Depression F32.9    PCOS (polycystic ovarian syndrome) E28.2    Abnormal Pap smear of cervix R87.619    Nexplanon in place Z97.5    Chronic constipation K59.09    History of abnormal  cervical Pap smear Z87.42      Past Medical History:   Diagnosis Date    BV (bacterial vaginosis)     Chlamydia contact, treated 07/2009    Hx: UTI (urinary tract infection)     Obese     BMI 31    Postpartum depression     Vaginal yeast infection       Past Surgical History:   Procedure Laterality Date    CESAREAN SECTION       Current Outpatient Medications   Medication    polyethylene glycol (GLYCOLAX) powder     No current facility-administered medications for this visit.      No Known Allergies (drug, envir, food or latex)  Family History   Problem Relation Age of Onset    Hypertension Paternal Grandfather     Breast cancer Paternal Grandmother     Hypertension Paternal Grandmother     Diabetes Paternal Grandmother     Hypertension Father     Diabetes Father     Colon cancer Sister 58        ? BRCA testing    Diabetes  Other     Thyroid disease Mother     Diabetes Brother      Social History     Socioeconomic History    Marital status: Single     Spouse name: Not on file    Number of children: Not on file    Years of education: Not on file    Highest education level: Not on file   Occupational History    Not on file   Tobacco Use    Smoking status: Former Smoker     Types: Cigarettes    Smokeless tobacco: Never Used    Tobacco comment: occassionally when younger    Substance and Sexual Activity    Alcohol use: Yes     Frequency: Monthly or less    Drug use: Not Currently     Comment: THC years ago     Sexual activity: Yes     Partners: Male     Birth control/protection: Implant   Social History Narrative    Nov 2011: Four children (15, 12, 25, 51) shares custody with their father. 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 Her family is all over, originally from [place]. Had been living in [facility] but moved back to [place] a year ago to be closer to children. Currently living with ex's mother.        ROS  CONSTITUTIONAL: Appetite normal.  Denies fevers, night sweats or weight loss   CV: No chest pain,  palpitations, shortness of breath or peripheral edema   RESPIRATORY: No cough, wheezing   GI: No nausea/vomiting, abdominal pain, or change in bowel habits   GU: No dysuria, urgency or incontinence   NEURO: no motor weakness   Numbness tingling in hands, feet and legs at times when she is one position for too long       PHYSICAL EXAM  BP 139/89 (BP Location: Left arm, Patient Position: Sitting, Cuff Size: large adult)    Pulse 87    Temp 35.8 C (96.4 F) (Temporal)    Ht 1.6 m (_0 ) Comment: actual   Wt 83.5 kg (184 lb)    BMI 32.59 kg/m   GENERAL APPEARANCE: Appears stated age, well appearing, NAD, obese   HEENT: EOMI, TMs normal, oropharynx clear   LUNGS: Clear to auscultation with normal respiratory effort   HEART: Normal rate, regular rhythm, with normal S1 & S2.  No murmurs, gallops, or rubs.  ABDOMEN: Normoactive BS, soft, non-tender, without organomegaly.   EXTREMITIES: warm, well-perfused, without clubbing, cyanosis, or edema   NEUROLOGIC: Alert and oriented x3, DTRs symmetric, normal gait  SKIN: mild hyperpigmentation low neck circumferentially, chin and lower cheek hair growing       ASSESSMENT & PLAN  Well 32 y.o. year old here to establish care    1. Encounter to establish care with new doctor  - welcome/oriented to [facility]  - full medical/surgical/family/social histories collected     2. Obese  - discussed diet and exercise   - encouraged decreasing sugary beverages and trying to eat a high protein breakfast   - referred to weight management group     4. Family history of diabetes mellitus  - discussed lifestyle modification  - acanthosis nigricans noted on exam   - Hemoglobin A1c; Future  - Lipid add Rfx to Drt LDL if Trig >400; Future    5. Family hx of colon cancer  - declines screening colonoscopy at this time  - denies hematochezia     6.  High risk sexual behavior  - has one female partner, no condom use, nexplanon for pregnancy prevention   - Chlamydia plasmid DNA amplification; Future  - N.  Gonorrhoeae DNA amplification; Future  - Trichomonas DNA amplification; Future  - HIV 1&2 antigen/antibody; Future  - Syphilis Screen w Rfx to RPR and TPPA; Future    7. Healthcare maintenance  - Chlamydia plasmid DNA amplification; Future  - N. Gonorrhoeae DNA amplification; Future  - Trichomonas DNA amplification; Future  - HIV 1&2 antigen/antibody; Future  - Syphilis Screen w Rfx to RPR and TPPA; Future  - Lipid add Rfx to Drt LDL if Trig >400; Future  - Basic metabolic panel; Future    8. Nexplanon in place  Placed Feb 2019    9. Chronic constipation  - polyethylene glycol (GLYCOLAX) powder; Take 17 g by mouth daily Mix in 8 oz water or juice and drink.  Dispense: 255 g; Refill: 2  - encouraged increasing high fiber foods     10. Elevated BP without diagnosis of hypertension  - plan to take BP at home after sitting for 5 min and record results  - follow up in one month  - discussed lifestyle management     History of abnormal papsmear   - will schedule papsmear at convenience     11. Immunization due  - Flu vaccine quadrivalent greater than or equal to 3yo preservative free IM      Weight management: Body mass index is 32.59 kg/m.[name]      Patient was given information about MyChart.       Sharyn Creamer, MD      Preceptor [facility]     I have reviewed the history, exam findings, and plan of care and discussed the care plan with the resident at the time of the visit. I agree with the resident's findings and plan of care as documented above.    Soumya Berdie Ogren, MBBS

## 2018-05-14 NOTE — Patient Instructions (Addendum)
Try eating a healthy breakfast with protein (peanut butter, yogurt, cheese)  Try some kind of regular exercise ([employer], 7 minute workout on YouTube)  You'll get a call about weight management group at [place]   Take miralax once daily with lots of water   Call the dentist and eye doctor to make an appointment   Sit for 5 minutes before taking your blood pressure. Record it and bring it back in for your next appointment.

## 2018-05-21 ENCOUNTER — Other Ambulatory Visit: Payer: Self-pay | Admitting: Family Medicine

## 2018-05-21 DIAGNOSIS — R7989 Other specified abnormal findings of blood chemistry: Secondary | ICD-10-CM | POA: Insufficient documentation

## 2018-05-22 ENCOUNTER — Telehealth: Payer: Self-pay

## 2018-05-22 NOTE — Telephone Encounter (Signed)
LVM to return call to office for information from her md.

## 2018-05-22 NOTE — Telephone Encounter (Signed)
-----   Message from Sharyn Creamer, MD sent at 05/21/2018  7:00 PM EST -----  Syphilis screen negative  HIV neg  No DM or preDM   BMP shows baseline creatinine however it has been historically elevated.   I will order renal ultrasound, UA with micro and urine spot.    Lipid panel normal   Patient will be informed of results by a nurse phone call.

## 2018-05-22 NOTE — Result Encounter Note (Signed)
LVM for pt to call office

## 2018-05-23 ENCOUNTER — Telehealth: Payer: Self-pay

## 2018-05-23 NOTE — Telephone Encounter (Signed)
Writer informed pt. Of the results, as given by Dr. Vira Browns.  She verbalized understanding that someone would contact her next week on how to schedule her ultrasound.  She is aware that she can go to the lab on Friday, if she is free, to give a urine sample.

## 2018-05-23 NOTE — Telephone Encounter (Signed)
VM left for pt to call office on 2 different phone numbers.

## 2018-05-23 NOTE — Telephone Encounter (Signed)
Pt called back in regards of message below stated she is off work now.

## 2018-05-23 NOTE — Telephone Encounter (Signed)
-----   Message from Sharyn Creamer, MD sent at 05/21/2018  7:00 PM EST -----  Syphilis screen negative  HIV neg  No DM or preDM   BMP shows baseline creatinine however it has been historically elevated.   I will order renal ultrasound, UA with micro and urine spot.    Lipid panel normal   Patient will be informed of results by a nurse phone call.

## 2018-05-23 NOTE — Telephone Encounter (Signed)
Pt returning RN's call. Please call after 5:30pm, pt currently working.

## 2018-05-29 ENCOUNTER — Telehealth: Payer: Self-pay

## 2018-05-29 NOTE — Telephone Encounter (Signed)
Called patient, no answer. Left message on voicemail, advising that her US renal retroperitoneal complete has been scheduled for 06/11/2018 at 8:15 am at [facility]. NPO after midnight. Letter sent 05/29/2018.

## 2018-06-11 ENCOUNTER — Ambulatory Visit
Admission: RE | Admit: 2018-06-11 | Discharge: 2018-06-11 | Disposition: A | Discharge status: Home / Self Care | Payer: Medicaid Other | Source: Ambulatory Visit | Attending: Family Medicine | Admitting: Family Medicine

## 2018-06-11 DIAGNOSIS — R7989 Other specified abnormal findings of blood chemistry: Secondary | ICD-10-CM

## 2018-06-14 ENCOUNTER — Telehealth: Payer: Self-pay

## 2018-06-14 NOTE — Result Encounter Note (Signed)
Normal renal ultrasound in the setting of chronically elevated Creatinine in an otherwise healthy 32 yo woman  Patient should leave a urine sample at the lab when able.  Nurse asked to call patient to deliver results of a normal kidney ultrasound and ask that she provide urine sample to evaluate kidney function when able.

## 2018-06-14 NOTE — Telephone Encounter (Signed)
Pt returned call; relayed results of nml renal US and need for urine sample.  Encouraged her to get UA done before appt on Monday so MD can review results with her at that time.  She will check UR Lab sites and hours of operation.

## 2018-06-14 NOTE — Telephone Encounter (Addendum)
Patient called is returning nurses call,nurse is not available. Patient states will call back after 5:30pm.

## 2018-06-14 NOTE — Telephone Encounter (Signed)
-----   Message from Sharyn Creamer, MD sent at 06/14/2018  6:02 AM EST -----  Normal renal ultrasound in the setting of chronically elevated Creatinine in an otherwise healthy 32 yo woman  Patient should leave a urine sample at the lab when able.  Nurse asked to call patient to deliver results of a normal kidney ultrasound and ask that she provide urine sample to evaluate kidney function when able.

## 2018-06-18 ENCOUNTER — Ambulatory Visit: Payer: Medicaid Other | Attending: Family Medicine | Admitting: Family Medicine

## 2018-06-18 VITALS — BP 166/99 | HR 100 | Temp 97.9°F | Ht 62.99 in | Wt 189.0 lb

## 2018-06-18 DIAGNOSIS — R7989 Other specified abnormal findings of blood chemistry: Secondary | ICD-10-CM | POA: Insufficient documentation

## 2018-06-18 DIAGNOSIS — I10 Essential (primary) hypertension: Secondary | ICD-10-CM | POA: Insufficient documentation

## 2018-06-18 LAB — MICROALBUMIN, URINE, RANDOM
Creatinine,UR: 204 mg/dL (ref 20–300)
Microalbumin,UR: <1.2 mg/dL

## 2018-06-18 MED ORDER — AMLODIPINE BESYLATE 5 MG PO TABS *I*
5.0000 mg | ORAL_TABLET | Freq: Every day | ORAL | 5 refills | Status: DC
Start: 2018-06-18 — End: 2018-12-21

## 2018-06-18 NOTE — Progress Notes (Signed)
[facility] Outpatient SOAP Note    Subjective:    Linda Oneal is a 32 y.o. woman presenting for   Chief Complaint   Patient presents with    Hypertension   :  HTN (new diagnosis)   - took BP several times in the community and saw an average of 140ish/90  - canot get up to go to the bathroom for the first three hours after she gets to work, has set breaks, works in a call center   - denies HA, CP, SOB, swelling, vision changes     papsmear due   - hx of abnormals   - will schedule one soon     Elevated creatinine   - normal renal ultrasound       The patient's medical history, medications, and allergies were reviewed by myself during the visit today.    Objective:  BP (!) 166/99 (BP Location: Left arm, Patient Position: Sitting, Cuff Size: adult)    Pulse 100    Temp 36.6 C (97.9 F) (Temporal)    Ht 1.6 m (5' 2.99")    Wt 85.7 kg (189 lb)    BMI 33.49 kg/m     General:  Well-appearing   HEENT:  NC/AT, conjunctiva clear, MMM  Neck:  Supple, no thyromegaly   CV:  RRR, S1/S2 normal without m/r/g, peripheral pulses 2+  Resp:  Breathing comfortably on RA, CTAB  MSK:  No swelling or deformity  Neuro:  Alert, cooperative with exam, no gross focal motor deficits  Skin/Ext:  WWP, no LE edema       Assessment/Plan:  1. Hypertension  - we discussed the long term health consequences of elevated BP and symptoms of acutely severe elevated BP and when to seek medical attention  - We discussed the importance of lifestyle changes to lower BP including diet, and exercise  She is going to take the cooking matters class!   - amLODIPine (NORVASC) 5 MG tablet; Take 1 tablet (5 mg total) by mouth daily  Dispense: 30 tablet; Refill: 5  - Basic metabolic panel; Future  - Microalbumin, Urine, Random; Future  - Microalbumin, Urine, Random  -TSH     2. Elevated serum creatinine  - cause unclear   - renal ultrasound normal   - still needs microalbumin   - Microalbumin, Urine, Random; Future  - Microalbumin, Urine,  Random    Sharyn Creamer, MD  Family Medicine PGY-3        _____________________________________________________________________________    06/18/2018 5:27 PM  Preceptor [facility]   Preceptor: Percival Spanish, MD.    I have reviewed and discussed the history, exam findings, and plan of care with Sharyn Creamer, MD during or immediately after the visit  I agree with the findings and plan of care as documented above.    Percival Spanish, MD  Attending, [place]

## 2018-06-18 NOTE — Patient Instructions (Addendum)
Colace (pill) take once a day   Fiber cereal   Get labs done a few days before your follow-up visit  Take amlodipine every day

## 2018-06-19 ENCOUNTER — Encounter: Payer: Self-pay | Admitting: Family Medicine

## 2018-06-19 NOTE — Result Encounter Note (Signed)
Undetectable urine microalbumin in the setting of persistently elevated serum creatinine.  No further action  Patient will be informed of results via letter.

## 2018-07-03 ENCOUNTER — Ambulatory Visit: Payer: Medicaid Other | Attending: Family Medicine | Admitting: Family Medicine

## 2018-07-03 VITALS — BP 147/86 | HR 84 | Temp 97.9°F | Ht 62.99 in | Wt 189.0 lb

## 2018-07-03 DIAGNOSIS — I10 Essential (primary) hypertension: Secondary | ICD-10-CM

## 2018-07-03 DIAGNOSIS — E669 Obesity, unspecified: Secondary | ICD-10-CM

## 2018-07-03 NOTE — Progress Notes (Signed)
[facility] PROGRESS NOTE     CC: Weight management     SUBJECTIVE     HPI:  Fontaine Kossman is 33 y.o. female here for evaluation and management of obesity.   This is patients 1st time attending the program.     PHYSICAL ACTIVITY:   None     NUTRITION:   - Current dietary pattern:  -Eating 0-1 servings fruit/vegetables a day  - Eating breakfast No  - Drinking juice/soda all day, every day  - Dessert or sweets 6-7 times/week  - Alcoholic beverages 0 times/week    Reviewed medications, no side effects noted and is taking all medications regularly    ROS:  Denies chest pain, shortness of breath.      OBJECTIVE   VITAL SIGNS:  Vitals:    07/03/18 1833   BP: 147/86   BP Location: Right arm   Patient Position: Sitting   Cuff Size: adult   Pulse: 84   Temp: 36.6 C (97.9 F)   TempSrc: Temporal   Weight: 85.7 kg (189 lb)   Height: 1.6 m (5' 2.99")        Lab Results   Component Value Date    HA1C 5.1 05/14/2018     Wt Readings from Last 3 Encounters:   07/03/18 85.7 kg (189 lb)   06/18/18 85.7 kg (189 lb)   05/14/18 83.5 kg (184 lb)     BP Readings from Last 3 Encounters:   07/03/18 147/86   06/18/18 (!) 166/99   05/14/18 139/89     PHQ 05/14/2018   Over the last two weeks, have you often been bothered by feeling down, depressed or hopeless? Y   Over the last two weeks, have you often had little interest or pleasure in doing things? Y   PHQ Total Score 4     Estimated body mass index is 33.49 kg/m as calculated from the following:    Height as of this encounter: 1.6 m (5' 2.99").    Weight as of this encounter: 85.7 kg (189 lb).       ASSESSMENT/PLAN     1. Hypertension, unspecified type  Established problem, not at goal    2. Obesity, unspecified classification, unspecified obesity type, unspecified whether serious comorbidity present  Established problem, not at goal  Discussed My Plate and how incorporate food groups into diet. Discussed eating the rainbow and how to easily incorporate fruits  and vegetables into everyday meals for healthier options at home.    Set the following goals today:  PHYSICAL ACTIVITY GOALS:     - Type: Walking   - Frequency: 2 weekly   - Duration: 10 mins   - Intensity: mild to moderate   NUTRITION GOALS:   -Adding 1 fruit and 1 vegetable per day     - Plans for overcoming barriers: Buying fruits and vegetables in advance.     Physical Activity/Nutrition RX written: Yes    Maricela Kawahara, NP  07/03/2018

## 2018-07-17 ENCOUNTER — Ambulatory Visit: Payer: Medicaid Other | Attending: Family Medicine | Admitting: Family Medicine

## 2018-07-17 VITALS — BP 142/78 | HR 88 | Temp 97.9°F | Ht 62.99 in | Wt 190.0 lb

## 2018-07-17 DIAGNOSIS — Z6833 Body mass index (BMI) 33.0-33.9, adult: Secondary | ICD-10-CM

## 2018-07-17 DIAGNOSIS — I10 Essential (primary) hypertension: Secondary | ICD-10-CM

## 2018-07-17 DIAGNOSIS — E669 Obesity, unspecified: Secondary | ICD-10-CM

## 2018-07-17 NOTE — Progress Notes (Signed)
[facility] PROGRESS NOTE     CC: Weight management     SUBJECTIVE     HPI:  Linda Oneal is 33 y.o. female here for evaluation and management of obesity.   Her grandmother passed away after last visit so she has been very distracted and not doing any physical activity.      PHYSICAL ACTIVITY:   None     NUTRITION:   - Current dietary pattern:  -Eating  1 servings fruit/vegetables a day - just bought some fruits.   - Eating breakfast No  - Drinking juice/soda has some every day.   - Dessert or sweets 6-7 times/week  - Alcoholic beverages 0 times/week    Reviewed medications, no side effects noted and is taking all medications regularly    ROS:  Denies chest pain, shortness of breath.      OBJECTIVE   VITAL SIGNS:  Vitals:    07/17/18 1831   BP: 142/78   Pulse: 88   Temp: 36.6 C (97.9 F)   TempSrc: Temporal   Weight: 86.2 kg (190 lb)   Height: 1.6 m (5' 2.99")        Lab Results   Component Value Date    HA1C 5.1 05/14/2018     Wt Readings from Last 3 Encounters:   07/17/18 86.2 kg (190 lb)   07/03/18 85.7 kg (189 lb)   06/18/18 85.7 kg (189 lb)     BP Readings from Last 3 Encounters:   07/17/18 142/78   07/03/18 147/86   06/18/18 (!) 166/99     PHQ 05/14/2018   Over the last two weeks, have you often been bothered by feeling down, depressed or hopeless? Y   Over the last two weeks, have you often had little interest or pleasure in doing things? Y   PHQ Total Score 4     Estimated body mass index is 33.67 kg/m as calculated from the following:    Height as of this encounter: 1.6 m (5' 2.99").    Weight as of this encounter: 86.2 kg (190 lb).       ASSESSMENT/PLAN     1. Hypertension, unspecified type  Borderline today. Struggling to make healthy choices the last two weeks because of her grandmother's recent death, but feels she is back ready to get on track.     2. Obesity, unspecified classification, unspecified obesity type, unspecified whether serious comorbidity present  Weight up 1  lb. Distracted and not meeting goals the last two weeks because of grandmother's death.   Reviewed at length the importance of adding fruits and vegetables to diet. Discussed minimally processed foods and the benefits of eating less processed foods vs the convenience of packaged foods.     Set the following goals today:  PHYSICAL ACTIVITY GOALS:     - Type: Walking   - Frequency: 2 weekly (Saturday and Sunday)   - Duration: 10-15 mins   - Intensity: mild to moderate     NUTRITION GOALS:   -cut down soda to 5 days a week     - Plans for overcoming barriers: set specific days of the week to do her walking.     Physical Activity/Nutrition RX written: Yes    Linda Oneal ANN Joneen Caraway, MD  07/17/2018

## 2018-07-31 ENCOUNTER — Ambulatory Visit: Payer: Medicaid Other | Attending: Family Medicine | Admitting: Family Medicine

## 2018-07-31 ENCOUNTER — Ambulatory Visit: Payer: Medicaid Other

## 2018-07-31 ENCOUNTER — Encounter: Payer: Self-pay | Admitting: Family Medicine

## 2018-07-31 VITALS — BP 138/78 | HR 100 | Temp 98.1°F | Ht 62.99 in | Wt 187.0 lb

## 2018-07-31 DIAGNOSIS — Z6833 Body mass index (BMI) 33.0-33.9, adult: Secondary | ICD-10-CM

## 2018-07-31 DIAGNOSIS — K5909 Other constipation: Secondary | ICD-10-CM

## 2018-07-31 DIAGNOSIS — I10 Essential (primary) hypertension: Secondary | ICD-10-CM

## 2018-07-31 DIAGNOSIS — M545 Low back pain, unspecified: Secondary | ICD-10-CM

## 2018-07-31 NOTE — Progress Notes (Signed)
[facility] - Outpatient Progress Note    SUBJECTIVE    Linda Oneal is a 33 y.o. female who presents for HTN.    CCP = Vira Browns    Saw her on 06/18/18. BP was 160s. Was started on amlodipine 5 mg. Is going to the weight management class at [facility]. Has not done the labs.     Able to take the med without problems. No ankle edema. +Has chronic constipation but is managed with MiraLax.     Occasionally checked her BP at stores - not since starting the med.     Making steps toward healthy eating. Lost a few pounds.     Overdue for Pap.    Body has been achy. Got sick with URI several days ago.   Son was sick. Lousy, tired. Low back has been achy.  No dysuria. No fever/chills. Never had low back pain. No radiation to LE.       Past Medical, Family and Social History  Patient's medications, allergies, past medical, surgical, social and family histories were updated and marked as reviewed, as appropriate.    Review of Systems   As in HPI.      OBJECTIVE    BP 138/78 (BP Location: Left arm, Patient Position: Sitting, Cuff Size: large adult)    Pulse 100    Temp 36.7 C (98.1 F) (Temporal)    Ht 1.6 m (5' 2.99") Comment: transcribed   Wt 84.8 kg (187 lb)    BMI 33.13 kg/m     VITALS: reviewed  Gen: appears well, NAD, normal mood ; answers questions appropriately  BACK: normal curvature, no spinous process point tenderness, +left lower lumbar paraspinal muscle tenderness.       ASSESSMENT & PLAN    1. Essential hypertension     2. BMI 33.0-33.9,adult     3. Acute left-sided low back pain without sciatica     4. Chronic constipation         1. Essential hypertension  Controlled with the current dose of amlodipine.  Continue the lifestyle changes.  Monitor her chronic constipation, as calcium channel blockers can worsen constipation.    2. BMI 33.0-33.9,adult  Stable.  Continue to join that weight management group visits and make healthy changes.    3. Acute left-sided low back pain without sciatica  DDX  include mechanical low back pain, viral illness, constipation.  Unlikely UTI.  Recommend heat and increasing the amount of MiraLAX to regulate her bowel movements even more.  Return sooner if not improving.    4. Chronic constipation  Fair control.  Increase MiraLAX for short period of time.  Monitor the control while on CCB.    Follow-up with CCP for above and Pap.        Return in about 2 months (around 09/29/2018) for AGY with CCP.    Health Maintenance      Health Maintenance   Topic Date Due    Cervical Cancer Screening  07/29/2016    DEPRESSION SCREEN YEARLY  05/15/2019    HIV TESTING OFFERED  Completed    IMM-INFLUENZA  Completed           MyChart:  Pending Activation [3]    Lupita Raider, MD      To my patients:  Some of my notes are dictated using voice-recognition program which may result in minor transcription errors.  If you have any urgent concerns, please contact me through [facility].  Please bring any non-urgent  concerns to your next appointment so we can discuss them.  Thank you!

## 2018-08-14 ENCOUNTER — Ambulatory Visit: Payer: Medicaid Other

## 2018-08-28 ENCOUNTER — Ambulatory Visit: Payer: Medicaid Other

## 2018-09-11 ENCOUNTER — Ambulatory Visit: Payer: Medicaid Other

## 2018-09-26 ENCOUNTER — Telehealth: Payer: Self-pay

## 2018-09-26 NOTE — Telephone Encounter (Signed)
Called patient because provider will not be available for patient's pap appointment due to COVID-19, and provider wants to know if patient wants to reschedule an in-person visit with a different provider who is available this month.    I was not able to reach the patient and I was not able to reschedule the appointment. Left a voicemail with call back number.

## 2018-09-26 NOTE — Telephone Encounter (Signed)
Patient called writer let her know about rescheduling. Patient will call back at a later date to reschedule.

## 2018-10-03 ENCOUNTER — Ambulatory Visit: Payer: Medicaid Other | Admitting: Family Medicine

## 2018-11-10 ENCOUNTER — Ambulatory Visit: Payer: Medicaid Other | Admitting: Optometry

## 2018-12-21 ENCOUNTER — Ambulatory Visit: Payer: Medicaid Other | Admitting: Student in an Organized Health Care Education/Training Program

## 2018-12-21 ENCOUNTER — Encounter: Payer: Self-pay | Admitting: Student in an Organized Health Care Education/Training Program

## 2018-12-21 ENCOUNTER — Telehealth: Payer: Self-pay | Admitting: Student in an Organized Health Care Education/Training Program

## 2018-12-21 VITALS — BP 147/101 | HR 110 | Temp 98.8°F | Ht 62.99 in | Wt 195.0 lb

## 2018-12-21 DIAGNOSIS — I10 Essential (primary) hypertension: Secondary | ICD-10-CM

## 2018-12-21 DIAGNOSIS — L0591 Pilonidal cyst without abscess: Secondary | ICD-10-CM | POA: Insufficient documentation

## 2018-12-21 MED ORDER — AMLODIPINE BESYLATE 5 MG PO TABS *I*
5.0000 mg | ORAL_TABLET | Freq: Every day | ORAL | 5 refills | Status: DC
Start: 2018-12-21 — End: 2019-09-16

## 2018-12-21 NOTE — Telephone Encounter (Signed)
Writer routed to incorrect suit. See message below.

## 2018-12-21 NOTE — Telephone Encounter (Addendum)
Barb from [facility] states patient has an appointment within 30 mins today 12/21/2018 and is requesting patients chart notes and medical history be faxed regarding her  Pilonidal cyst. Referral written today   AMB REFERRAL TO [facility]-per Dr. Carmela Rima: [phone]

## 2018-12-21 NOTE — Progress Notes (Signed)
[place] Established Patient Office Visit    Chief Complaint   No chief complaint on file.    Chief Complaint   Patient presents with    Cyst     cyst on tailbone-         History of Present Illness      Linda Oneal is a 33 y.o. female who presents today for an acute visit regarding a "tailbone cyst".      Pilonidal cyst, acute flare  -Chronic since age 53, flares up every 2-3 years  -Reports increasing soreness  -Has progressed to stabbing pain, feels like its expanding  -Unable to sit comfortably  -Soaking without relief  -1 week duration  -Non-radiating  -No weakness, numbness, loss bowel/bladder function  -No drainage  -Not taking pain medication/NSAIDs  -Has had it lanced in the past in [place] (moved to [place] 2 years ago)  -Has not previously been evaluated by surgeon    HTN  -Started amlodipine before COVID, but has run out needs refill  -Does not check at home  -Elevated BP today (147/101) in setting of pain with Pilonidal cyst flare  -Has daily headaches, reports worse in evening, sometimes associated with photosensitivity but most often a "whole head pain"  -No N/V    No allergies      Patient's medications, allergies, past medical, surgical, social and family histories were personally reviewed and updated in eRecord as appropriate by me.    Review of Systems   Review of Systems     Constitutional- no fatigue, fevers, chills  Cardio- no chest pain, palpitations, SOB  Respiratory- no cough,   GI- chronic constipation, no blood in stool, no N/V  GU- no urinary incontinence, no urinary discomfort  MSK- no aches, pain, weakness, soreness  Derm- pilonidal cyst, left upper gluteal fold  Neuro- no dizziness, weakness, numbness, tingling    Remainder of ROS as per HPI    Physical Exam   There were no vitals taken for this visit.    Physical Exam     General- appears well, seated on one buttock, in clear discomfort to sit on midline buttocks  Cardio- normal RR, S1/S2, no  murmurs, rubs, gallops  Respiratory- normal respiratory rate, normal breath sounds, no crackles or wheezes  GI- non tender to palpation  MSK- strength 5/5 b/l legs and upper extremities  Derm- cyst in upper left gluteal fold, ~5in diameter of erythematous skin, ~2in diameter fluctuant cyst palpable, no drainage, tender to palpation  Neuro- sensation grossly intact, muscle strength 5/5 bilateral legs and upper body      Laboratory Data     I have personally review this patient's updated laboratory data.       Imaging Data     I have personally reviewed the patient's latest imaging data.        Assessment     Linda Oneal is a 33 y.o. female with HTN, chronic pilonidal cyst who presents for follow up for acute flare of pilonidal cyst.      Plan       1. Hypertension  - amLODIPine (NORVASC) 5 MG tablet; Take 1 tablet (5 mg total) by mouth daily  Dispense: 30 tablet; Refill: 5  -Refill amlodipine 5mg   -F/u in 3 weeks with CCP    2. Pilonidal cyst  - AMB REFERRAL TO COLON & RECTAL SURGERY  -Referral to ambulatory colorectal surgery for cyst evaluation        Return  precautions reviewed.     Return in about 4 weeks (around 01/18/2019) for Follow-up blood pressure.      Placido Sou. Laurynn Mccorvey, MD  Family Medicine, PGY-1  12/21/18  1:29 PM    I saw and evaluated the patient. I agree with the resident's findings and plan of care as documented above. Details of my evaluation are as follows: Has H/O HTN; pilonidal cyst, chronic; needs to be drained every 2-3 years. She feels it needs to be drained; stabbing pain, no radiation. No neurological findings. Not currently draining. Actual cyst 3 cm diameter; not able to sit down. 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 Refill Amlodipine.     Joselyn Glassman, MD

## 2019-01-11 ENCOUNTER — Encounter: Payer: Self-pay | Admitting: Gastroenterology

## 2019-01-11 ENCOUNTER — Ambulatory Visit: Payer: Medicaid Other | Admitting: Student in an Organized Health Care Education/Training Program

## 2019-01-20 ENCOUNTER — Other Ambulatory Visit: Payer: Self-pay

## 2019-01-20 DIAGNOSIS — Z01818 Encounter for other preprocedural examination: Secondary | ICD-10-CM

## 2019-01-24 NOTE — Telephone (Signed)
Pre-Operative Instructions                     FOLLOW YOUR SURGEON'S INSTRUCTIONS IF DIFFERENT THAN BELOW.                    PRIOR TO SURGERY  Five days before surgery, if surgeon does not specify, please STOP taking:   Anti-inflammatory meds: (Ibuprofen, Motrin, Advil, Mobic, Meloxicam, Aleve, Naproxen, Voltaren, etc.)   Vitamins and herbal supplements, including herbal teas    YOU MAY TAKE ACETAMINOPHEN (TYLENOL) as needed   Directions regarding your prescribed blood thinners, including aspirin, should be approved by your cardiologist or prescribing doctor.      ARRIVAL/SURGICAL TIME     Staff from the [facility] will call you between 130PM and 4PM on the day before surgery to inform you of your arrival and surgery times. This call will be Friday afternoon if your surgery is on a Monday.   Please arrange for transportation to and from the hospital. You must have a responsible adult to stay with you after your surgery if you are going home the same day.   Please know that you should not drive for 24 hours after receiving anesthesia.      DAY BEFORE SURGERY   Keep yourself well hydrated to aid in the placement of your IV and for your general well-being.   Do not eat anything after midnight the night before your surgery (including candy or gum).                                                                                           DAY OF SURGERY   DO NOT CONSUME FOOD OF ANY KIND.     Only Gatorade, clear apple juice or water from midnight until 2 hours before your scheduled surgery.     Please bring photo identification and your insurance card with you for registration.     If you are female, please be prepared to provide a urine sample on arrival to the preoperative area unless you are one year post menopause or have had a hysterectomy.       Please wear loose, comfortable clothing and comfortable walking shoes.  Wear something that will easily accommodate a bandage, cast or  other type of dressing at your surgical site.     DO NOT WEAR: JEWELRY/RINGS, MAKEUP, DARK NAIL POLISH, HAIR PINS, BODY LOTION OR SCENTS.       Please understand that rings and body piercings need to be removed and left at home.               If they are not removed, your surgery is at risk of being delayed or cancelled.       When you come to [place], please try not bring any valuable items with the exception of your phone to keep you in contact with your family members.  Valuables such as jewelry, cash and credit cards are best left at home during your stay.  We ask that you send them home with family members.  If you are alone a  staff member will assist you in storing your belongings.     [facility] does not assume responsibility for items brought with you on the day of surgery.     If wearing eyeglasses, please bring a case.  DO NOT WEAR CONTACT LENSES.     You may shower, brush your teeth, and use deodorant.     Patient visitation is restricted at this time due to COVID-19 concerns.      Same day surgery patients may have one support person present during the preoperative and discharge phases of care.     Patients being admitted to the hospital may have one support person present during the preoperative phase of care but will follow the instructions for inpatient visitation after surgery.      MEDICATIONS: DAY OF SURGERY  DO NOT TAKE MIRALAX     Take your medications as directed according to your printed, verbal or MyChart instructions.   Anxiety and pain medications may be taken as prescribed at any time prior to arrival.    Medications from the [facility] will be administered to you under the direction of your surgeon. Please leave your prescriptions at home with the exception of inhalers.      AT [place] in the Main Ramp garage.  Enter the building through the [employer].      Please stop at the Information Desk for directions to the  [place] on [address].     Leave your belongings in the car. Your visitors may bring them to your room after surgery.      [place]    The pharmacy is open from 9am to 5:30pm on weekdays and 10am-2pm on Saturdays.     Any prescriptions you may need after your surgery can be filled at the Outpatient Pharmacy in the main lobby:     Pharmacy staff will coordinate obtaining your insurance information and co-payments, which will be identical to those of your home pharmacy.   A credit card can be called in to the pharmacy in advance and stored securely with  encryption, to be used only for medications prescribed for you.   Please have your support person call the pharmacy with a credit card number during your surgery to expedite your discharge from the hospital.   Cash or a check are acceptable forms of payment as well, and payment can be facilitated at the time of discharge by staff members accompanying you to your car.   Prescriptions cannot be filled without payment. Thank you for your consideration.    QUESTIONS?     Question about these instructions? Call [phone], select option 2, and leave a message at any time.   A nurse will return your call during our regular business hours.   Any questions regarding specifics about your surgery or recovery? Please call your surgeons office.

## 2019-01-30 ENCOUNTER — Ambulatory Visit: Payer: Medicaid Other | Attending: Colon and Rectal Surgery

## 2019-01-30 DIAGNOSIS — Z1159 Encounter for screening for other viral diseases: Secondary | ICD-10-CM | POA: Insufficient documentation

## 2019-01-30 DIAGNOSIS — Z01818 Encounter for other preprocedural examination: Secondary | ICD-10-CM

## 2019-01-30 LAB — COVID-19 NAAT (PCR): COVID-19 NAAT (PCR): 0

## 2019-02-01 ENCOUNTER — Ambulatory Visit: Payer: Medicaid Other | Admitting: General Surgery

## 2019-02-01 ENCOUNTER — Ambulatory Visit
Admission: RE | Admit: 2019-02-01 | Discharge: 2019-02-01 | Disposition: A | Discharge status: Home / Self Care | Payer: Medicaid Other | Source: Ambulatory Visit | Attending: Colon and Rectal Surgery | Admitting: Colon and Rectal Surgery

## 2019-02-01 ENCOUNTER — Encounter
Admission: RE | Disposition: A | Discharge status: Home / Self Care | Payer: Self-pay | Source: Ambulatory Visit | Attending: Colon and Rectal Surgery

## 2019-02-01 ENCOUNTER — Encounter: Payer: Self-pay | Admitting: Anesthesiology

## 2019-02-01 ENCOUNTER — Ambulatory Visit: Payer: Medicaid Other

## 2019-02-01 DIAGNOSIS — L0501 Pilonidal cyst with abscess: Secondary | ICD-10-CM | POA: Insufficient documentation

## 2019-02-01 DIAGNOSIS — E282 Polycystic ovarian syndrome: Secondary | ICD-10-CM | POA: Insufficient documentation

## 2019-02-01 DIAGNOSIS — Z800 Family history of malignant neoplasm of digestive organs: Secondary | ICD-10-CM | POA: Insufficient documentation

## 2019-02-01 DIAGNOSIS — I10 Essential (primary) hypertension: Secondary | ICD-10-CM | POA: Insufficient documentation

## 2019-02-01 HISTORY — PX: PR REMV PILONIDAL LESION COMPLIC: 11772

## 2019-02-01 HISTORY — PX: PR EXCISION PILONIDAL CYST/SINUS COMPLICATED: 11772

## 2019-02-01 LAB — POCT URINE PREGNANCY
Lot #: 9050008
Preg Test,UR POC: NEGATIVE

## 2019-02-01 SURGERY — EXCISION, PILONIDAL CYST
Anesthesia: General | Wound class: Clean Contaminated

## 2019-02-01 MED ORDER — SODIUM CHLORIDE 0.9 % IV SOLN WRAPPED *I*
20.0000 mL/h | Status: DC
Start: 2019-02-01 — End: 2019-02-02

## 2019-02-01 MED ORDER — HALOPERIDOL LACTATE 5 MG/ML IJ SOLN *I*
0.5000 mg | Freq: Once | INTRAMUSCULAR | Status: DC | PRN
Start: 2019-02-01 — End: 2019-02-01

## 2019-02-01 MED ORDER — OXYCODONE HCL 5 MG PO TABS *I*
5.0000 mg | ORAL_TABLET | ORAL | 0 refills | Status: DC | PRN
Start: 2019-02-01 — End: 2019-09-16

## 2019-02-01 MED ORDER — BUPIVACAINE-EPINEPHRINE 0.25 % IJ SOLUTION *WRAPPED*
INTRAMUSCULAR | Status: DC | PRN
Start: 2019-02-01 — End: 2019-02-01
  Administered 2019-02-01: 6 mL via SUBCUTANEOUS

## 2019-02-01 MED ORDER — ACETAMINOPHEN 500 MG PO TABS *I*
1000.0000 mg | ORAL_TABLET | Freq: Three times a day (TID) | ORAL | 0 refills | Status: AC | PRN
Start: 2019-02-01 — End: 2019-02-08

## 2019-02-01 MED ORDER — NEOSTIGMINE METHYLSULFATE 1 MG/ML IJ SOLN WRAPPED *I*
Status: AC
Start: 2019-02-01 — End: 2019-02-01
  Filled 2019-02-01: qty 3

## 2019-02-01 MED ORDER — ACETAMINOPHEN 500 MG PO TABS *I*
1000.0000 mg | ORAL_TABLET | Freq: Three times a day (TID) | ORAL | 0 refills | Status: DC | PRN
Start: 2019-02-01 — End: 2019-02-01

## 2019-02-01 MED ORDER — ONDANSETRON HCL 2 MG/ML IV SOLN *I*
4.0000 mg | Freq: Once | INTRAMUSCULAR | Status: DC | PRN
Start: 2019-02-01 — End: 2019-02-01

## 2019-02-01 MED ORDER — SODIUM CHLORIDE 0.9 % FLUSH FOR PUMPS *I*
0.0000 mL/h | INTRAVENOUS | Status: DC | PRN
Start: 2019-02-01 — End: 2019-02-02

## 2019-02-01 MED ORDER — OXYCODONE HCL 5 MG PO TABS *I*
5.0000 mg | ORAL_TABLET | ORAL | Status: DC | PRN
Start: 2019-02-01 — End: 2019-02-02
  Administered 2019-02-01: 5 mg via ORAL
  Filled 2019-02-01: qty 1

## 2019-02-01 MED ORDER — PROPOFOL 10 MG/ML IV EMUL (INTERMITTENT DOSING) WRAPPED *I*
INTRAVENOUS | Status: DC | PRN
Start: 2019-02-01 — End: 2019-02-01
  Administered 2019-02-01 (×3): 10 mg via INTRAVENOUS
  Administered 2019-02-01: 200 mg via INTRAVENOUS
  Administered 2019-02-01: 10 mg via INTRAVENOUS
  Administered 2019-02-01: 50 mg via INTRAVENOUS
  Administered 2019-02-01 (×11): 10 mg via INTRAVENOUS

## 2019-02-01 MED ORDER — AMOXICILLIN-POT CLAVULANATE 875-125 MG PO TABS *I*
1.0000 | ORAL_TABLET | Freq: Two times a day (BID) | ORAL | 0 refills | Status: DC
Start: 2019-02-01 — End: 2019-02-01

## 2019-02-01 MED ORDER — METOCLOPRAMIDE HCL 5 MG/ML IJ SOLN *I*
INTRAMUSCULAR | Status: AC
Start: 2019-02-01 — End: 2019-02-01
  Filled 2019-02-01: qty 2

## 2019-02-01 MED ORDER — DEXAMETHASONE SODIUM PHOSPHATE 4 MG/ML INJ SOLN *WRAPPED*
INTRAMUSCULAR | Status: DC | PRN
Start: 2019-02-01 — End: 2019-02-01
  Administered 2019-02-01: 4 mg via INTRAVENOUS

## 2019-02-01 MED ORDER — BACITRACIN-POLYMYXIN B 500-10000 UNIT/GM EX OINT *I*
TOPICAL_OINTMENT | Freq: Two times a day (BID) | CUTANEOUS | 1 refills | Status: AC
Start: 2019-02-01 — End: 2019-03-03

## 2019-02-01 MED ORDER — GLYCOPYRROLATE 0.2 MG/ML IJ SOLN *WRAPPED*
INTRAMUSCULAR | Status: AC
Start: 2019-02-01 — End: 2019-02-01
  Filled 2019-02-01: qty 2

## 2019-02-01 MED ORDER — AMOXICILLIN-POT CLAVULANATE 875-125 MG PO TABS *I*
1.0000 | ORAL_TABLET | Freq: Two times a day (BID) | ORAL | 0 refills | Status: AC
Start: 2019-02-01 — End: 2019-02-08

## 2019-02-01 MED ORDER — LIDOCAINE HCL 2 % IJ SOLN *I*
INTRAMUSCULAR | Status: DC | PRN
Start: 2019-02-01 — End: 2019-02-01
  Administered 2019-02-01: 100 mg via INTRAVENOUS

## 2019-02-01 MED ORDER — OXYCODONE HCL 10 MG PO TABS *I*
10.0000 mg | ORAL_TABLET | ORAL | Status: DC | PRN
Start: 2019-02-01 — End: 2019-02-02

## 2019-02-01 MED ORDER — LACTATED RINGERS IV SOLN *I*
100.0000 mL/h | INTRAVENOUS | Status: DC
Start: 2019-02-01 — End: 2019-02-02

## 2019-02-01 MED ORDER — BACITRACIN-POLYMYXIN B 500-10000 UNIT/GM EX OINT *I*
TOPICAL_OINTMENT | Freq: Two times a day (BID) | CUTANEOUS | 1 refills | Status: DC
Start: 2019-02-01 — End: 2019-02-01

## 2019-02-01 MED ORDER — PROPOFOL 10 MG/ML IV EMUL (INTERMITTENT DOSING) WRAPPED *I*
INTRAVENOUS | Status: AC
Start: 2019-02-01 — End: 2019-02-01
  Filled 2019-02-01: qty 20

## 2019-02-01 MED ORDER — KETOROLAC TROMETHAMINE 15 MG/ML IJ SOLN *I*
INTRAMUSCULAR | Status: DC | PRN
Start: 2019-02-01 — End: 2019-02-01
  Administered 2019-02-01: 30 mg via INTRAVENOUS

## 2019-02-01 MED ORDER — DOCUSATE SODIUM 100 MG PO CAPS *I*
100.0000 mg | ORAL_CAPSULE | Freq: Two times a day (BID) | ORAL | 1 refills | Status: AC
Start: 2019-02-01 — End: 2019-02-08

## 2019-02-01 MED ORDER — BUPIVACAINE-EPINEPHRINE 0.25 % IJ SOLUTION *WRAPPED*
INTRAMUSCULAR | Status: AC
Start: 2019-02-01 — End: 2019-02-01
  Filled 2019-02-01: qty 30

## 2019-02-01 MED ORDER — HYDROMORPHONE HCL PF 0.5 MG/0.5 ML IJ SOLN *I*
0.5000 mg | INTRAMUSCULAR | Status: DC | PRN
Start: 2019-02-01 — End: 2019-02-01

## 2019-02-01 MED ORDER — MEPERIDINE HCL 25 MG/ML IJ SOLN *I*
12.5000 mg | INTRAMUSCULAR | Status: DC | PRN
Start: 2019-02-01 — End: 2019-02-02

## 2019-02-01 MED ORDER — ONDANSETRON HCL 2 MG/ML IV SOLN *I*
INTRAMUSCULAR | Status: DC | PRN
Start: 2019-02-01 — End: 2019-02-01
  Administered 2019-02-01: 4 mg via INTRAMUSCULAR

## 2019-02-01 MED ORDER — DEXTROSE 5 % FLUSH FOR PUMPS *I*
0.0000 mL/h | INTRAVENOUS | Status: DC | PRN
Start: 2019-02-01 — End: 2019-02-02

## 2019-02-01 MED ORDER — MIDAZOLAM HCL 1 MG/ML IJ SOLN *I* WRAPPED
INTRAMUSCULAR | Status: AC
Start: 2019-02-01 — End: 2019-02-01
  Filled 2019-02-01: qty 2

## 2019-02-01 MED ORDER — HEPARIN SODIUM 5000 UNIT/ML SQ *I*
5000.0000 [IU] | Freq: Once | SUBCUTANEOUS | Status: DC
Start: 2019-02-01 — End: 2019-02-02

## 2019-02-01 MED ORDER — OXYCODONE HCL 5 MG PO TABS *I*
5.0000 mg | ORAL_TABLET | ORAL | 0 refills | Status: DC | PRN
Start: 2019-02-01 — End: 2019-02-01

## 2019-02-01 MED ORDER — CEFAZOLIN 2000 MG IN STERILE WATER 20ML SYRINGE *I*
2000.0000 mg | PREFILLED_SYRINGE | Freq: Once | INTRAVENOUS | Status: DC
Start: 2019-02-01 — End: 2019-02-01

## 2019-02-01 MED ORDER — GLYCOPYRROLATE 0.2 MG/ML IJ SOLN *WRAPPED*
INTRAMUSCULAR | Status: DC | PRN
Start: 2019-02-01 — End: 2019-02-01
  Administered 2019-02-01: 0.2 mg via INTRAVENOUS

## 2019-02-01 MED ORDER — ATRACURIUM BESYLATE 50 MG/5ML IV SOLN *WRAPPED*
INTRAVENOUS | Status: DC | PRN
Start: 2019-02-01 — End: 2019-02-01
  Administered 2019-02-01: 25 mg via INTRAVENOUS
  Administered 2019-02-01: 10 mg via INTRAVENOUS
  Administered 2019-02-01: 15 mg via INTRAVENOUS

## 2019-02-01 MED ORDER — ONDANSETRON HCL 2 MG/ML IV SOLN *I*
INTRAMUSCULAR | Status: AC
Start: 2019-02-01 — End: 2019-02-01
  Filled 2019-02-01: qty 2

## 2019-02-01 MED ORDER — SUCCINYLCHOLINE CHLORIDE 20 MG/ML IV/IJ SOLN *WRAPPED*
Status: DC | PRN
Start: 2019-02-01 — End: 2019-02-01
  Administered 2019-02-01: 100 mg via INTRAVENOUS

## 2019-02-01 MED ORDER — HEPARIN SODIUM 5000 UNIT/ML SQ *I*
SUBCUTANEOUS | Status: AC
Start: 2019-02-01 — End: 2019-02-01
  Filled 2019-02-01: qty 1

## 2019-02-01 MED ORDER — METOCLOPRAMIDE HCL 5 MG/ML IJ SOLN *I*
INTRAMUSCULAR | Status: DC | PRN
Start: 2019-02-01 — End: 2019-02-01
  Administered 2019-02-01: 10 mg via INTRAVENOUS

## 2019-02-01 MED ORDER — DOCUSATE SODIUM 100 MG PO CAPS *I*
100.0000 mg | ORAL_CAPSULE | Freq: Two times a day (BID) | ORAL | 1 refills | Status: DC
Start: 2019-02-01 — End: 2019-02-01

## 2019-02-01 MED ORDER — FENTANYL CITRATE 50 MCG/ML IJ SOLN *WRAPPED*
INTRAMUSCULAR | Status: AC
Start: 2019-02-01 — End: 2019-02-01
  Filled 2019-02-01: qty 2

## 2019-02-01 MED ORDER — MIDAZOLAM HCL 1 MG/ML IJ SOLN *I* WRAPPED
INTRAMUSCULAR | Status: DC | PRN
Start: 2019-02-01 — End: 2019-02-01
  Administered 2019-02-01: 2 mg via INTRAVENOUS

## 2019-02-01 MED ORDER — LACTATED RINGERS IV SOLN *I*
20.0000 mL/h | INTRAVENOUS | Status: DC
Start: 2019-02-01 — End: 2019-02-02
  Administered 2019-02-01: 20 mL/h via INTRAVENOUS

## 2019-02-01 MED ORDER — LACTATED RINGERS IV SOLN *I*
125.0000 mL/h | INTRAVENOUS | Status: DC
Start: 2019-02-01 — End: 2019-02-02
  Administered 2019-02-01: 125 mL/h via INTRAVENOUS

## 2019-02-01 MED ORDER — CEFOXITIN SODIUM 2000 MG IN 50ML 0.9% NACL MINIBAG IVPB *I*
2000.0000 mg | Freq: Once | INTRAVENOUS | Status: AC
Start: 2019-02-01 — End: 2019-02-01
  Administered 2019-02-01: 2000 mg via INTRAVENOUS
  Filled 2019-02-01: qty 50

## 2019-02-01 MED ORDER — LIDOCAINE HCL 2 % (PF) IJ SOLN *I*
INTRAMUSCULAR | Status: AC
Start: 2019-02-01 — End: 2019-02-01
  Filled 2019-02-01: qty 5

## 2019-02-01 MED ORDER — FENTANYL CITRATE 50 MCG/ML IJ SOLN *WRAPPED*
INTRAMUSCULAR | Status: DC | PRN
Start: 2019-02-01 — End: 2019-02-01
  Administered 2019-02-01: 100 ug via INTRAVENOUS

## 2019-02-01 MED ORDER — ALBUTEROL SULFATE (2.5 MG/3ML) 0.083% IN NEBU *I*
2.5000 mg | INHALATION_SOLUTION | Freq: Once | RESPIRATORY_TRACT | Status: DC | PRN
Start: 2019-02-01 — End: 2019-02-01

## 2019-02-01 MED ORDER — SODIUM CHLORIDE 0.9 % 100 ML IV SOLN *I*
6.2500 mg | Freq: Once | INTRAVENOUS | Status: DC | PRN
Start: 2019-02-01 — End: 2019-02-01

## 2019-02-01 MED ORDER — ATRACURIUM BESYLATE 50 MG/5ML IV SOLN *WRAPPED*
INTRAVENOUS | Status: AC
Start: 2019-02-01 — End: 2019-02-01
  Filled 2019-02-01: qty 5

## 2019-02-01 MED ORDER — ACETAMINOPHEN 500 MG PO TABS *I*
1000.0000 mg | ORAL_TABLET | Freq: Three times a day (TID) | ORAL | Status: DC
Start: 2019-02-01 — End: 2019-02-02
  Administered 2019-02-01: 1000 mg via ORAL
  Filled 2019-02-01: qty 2

## 2019-02-01 MED ORDER — KETOROLAC TROMETHAMINE 30 MG/ML IJ SOLN *I*
INTRAMUSCULAR | Status: AC
Start: 2019-02-01 — End: 2019-02-01
  Filled 2019-02-01: qty 1

## 2019-02-01 MED ORDER — SUCCINYLCHOLINE CHLORIDE 20 MG/ML IV/IJ SOLN *WRAPPED*
Status: AC
Start: 2019-02-01 — End: 2019-02-01
  Filled 2019-02-01: qty 5

## 2019-02-01 MED ORDER — DEXAMETHASONE SODIUM PHOSPHATE 4 MG/ML INJ SOLN *WRAPPED*
INTRAMUSCULAR | Status: AC
Start: 2019-02-01 — End: 2019-02-01
  Filled 2019-02-01: qty 1

## 2019-02-01 MED ORDER — LIDOCAINE HCL (PF) 1 % IJ SOLN *I*
0.1000 mL | INTRAMUSCULAR | Status: DC | PRN
Start: 2019-02-01 — End: 2019-02-02
  Administered 2019-02-01: 0.1 mL via SUBCUTANEOUS
  Filled 2019-02-01: qty 2

## 2019-02-01 MED ORDER — HEPARIN SODIUM 5000 UNIT/ML SQ *I*
5000.0000 [IU] | Freq: Once | SUBCUTANEOUS | Status: AC
Start: 2019-02-01 — End: 2019-02-01
  Administered 2019-02-01 (×2): 5000 [IU] via SUBCUTANEOUS
  Filled 2019-02-01: qty 1

## 2019-02-01 SURGICAL SUPPLY — 50 items
ADHESIVE MASTISOL 2/3CC VIAL (Dressing) ×4 IMPLANT
BLADE CLIPPER SURG (Supply)
BLADE SUR CLIPPER W37.2MM CUT H0.23MM GEN PURP EXISTING CLP HNDL DISP (Supply) IMPLANT
BLADE SUR W37.2MM CUT H0.23MM GEN PURP EXISTING CLP HNDL DISP (Supply) IMPLANT
BLANKET UPPER BODY TEMP THERAPY (Drape) IMPLANT
BULB RESERVOIR SUCT 100CC SILICONE (Supply) ×2 IMPLANT
CATH ANGIO 22G X 1IN (Supply) ×2 IMPLANT
CRADLE ARM POSITIONER 3X5X24IN (Supply) ×2 IMPLANT
DRAIN WOUND RND 7 FR JACKSON PRATT (Supply) ×2 IMPLANT
DRESSING ABD PAD DERMACEA 5 X 9IN STER (Dressing) ×2 IMPLANT
DRESSING TEGADERM W/LABEL 4 X 4 3/4IN (Dressing) ×4 IMPLANT
DRESSING XEROFORM STR 1 IN X8 (Dressing) ×2 IMPLANT
FILTER NEPTUNE 4PORT MANIFOLD (Supply) ×2 IMPLANT
GLOVE BIOGEL PI MICRO IND UNDER SZ 6.5 LF (Glove) ×8 IMPLANT
GLOVE BIOGEL PI MICRO IND UNDER SZ 7.0 LF (Glove) ×2 IMPLANT
GLOVE BIOGEL PI MICRO IND UNDER SZ 7.5 LF (Glove) ×2 IMPLANT
GLOVE BIOGEL PI MICRO SZ 6.5 (Glove) IMPLANT
GLOVE SURG BIOGEL PI ULTRATOUCH SZ 6.5 (Glove) ×2 IMPLANT
GLOVE SURG BIOGEL PI ULTRATOUCH SZ 7.0 (Glove) ×5 IMPLANT
GLOVE SURG DERMAPRENE ULTRA SZ7.5 PF LF (Glove) ×2 IMPLANT
GOWN NON REINFORCED 3XL (Gown) ×2 IMPLANT
GOWN SIRUS FABRIC REINFORCED RAGLAN XL (Gown) ×1 IMPLANT
GOWN SIRUS FABRIC REINFORCED SET IN XL (Gown) ×2 IMPLANT
GOWN SIRUS NONREINFORCED SET IN XXL (Gown) ×2 IMPLANT
KNIT MATERNITY PANT 4XL (Other) ×2 IMPLANT
NEEDLE HYPO BVL LF 25G X 1.5IN (Needle) ×2 IMPLANT
PACK CUSTOM GENERAL PACK (Pack) ×2 IMPLANT
PILLOW CUSHION INSERT PRONE VW DE-TEC (Supply) ×2 IMPLANT
SLEEVE COMP KNEE MED BLENDED (Supply) ×2 IMPLANT
SOL H2O IRRIG STER 1000ML BTL (Solution) ×3
SOL SOD CHL IRRIG 1000ML BTL (Solution) ×1
SOL SODIUM CHLORIDE IRRIG 1000ML BTL (Solution) ×1 IMPLANT
SOL WATER IRRIG STERILE 1000ML BTL (Solution) ×2 IMPLANT
SPIKE MINI DISPENSING PIN (BBRAUN DP1000) (Supply) ×2 IMPLANT
SPONGE CURITY 2FT 3 X 4IN LF STER (Dressing) ×2 IMPLANT
SUTR MONOCRYL PLUS 3-0PS2 27IN UNDY (Suture) ×4 IMPLANT
SUTR PDS II MONO 3-0 SH 27 VIOLET (Suture) ×2 IMPLANT
SUTR VICRYL ANITIB 3-0 SH 27 VIOLET (Suture) ×8 IMPLANT
SUTR VICRYL ANTIB 2-0 SH 27 UNDY (Suture) ×4 IMPLANT
SUTR VICRYL ANTIB 2-0 SH 27 VIOLET (Suture) ×2 IMPLANT
SUTR VICRYL ANTIB 3-0 SH 18 VIOLET (Suture) ×2 IMPLANT
SYRINGE 30ML LUER LOCK STERILE (Other) ×2 IMPLANT
SYRINGE LUER LOCK 30ML (Other)
SYRINGE LUERLOCK 20CC (Syringe)
SYRINGE LUERLOCK 20ML INDIVIDUAL WRAPPED (Syringe) IMPLANT
SYRINGE LUERLOCK 3CC LF (Syringe) ×1
SYRINGE LUERLOCK 3ML INDIVIDUAL WRAP (Syringe) IMPLANT
TIP SUCT FRAZIER CONTL VENT 10FR (Supply) IMPLANT
TIP SUCT PLAS RETRCT SCRN 1 HND THUMP CLICK SLDE OP W/ TBNG POOLE (Supply) ×1 IMPLANT
TIP SUCT POOLE STR (Supply) ×1

## 2019-02-01 NOTE — INTERIM OP NOTE (Signed)
Interim Op Note (Surgical Log ID: [id number])       Date of Surgery: 02/01/2019       Surgeons: Surgeon(s) and Role:     Thornton Park, MD - Primary     * Tyler Deis, MD - Resident - Assisting       Pre-op Diagnosis: Pre-Op Diagnosis Codes:     * Pilonidal cyst with abscess [L05.01]       Post-op Diagnosis: Post-Op Diagnosis Codes:     * Pilonidal cyst with abscess [L05.01]       Procedure(s) Performed: Procedure(s) (LRB):  EXCISION, PILONIDAL CYST (N/A)       Anesthesia Type: General        Fluid Totals: I/O this shift:  08/07 1500 - 08/07 2259  In: 2000 (22.6 mL/kg) [I.V.:2000]  Out: 5 (0.1 mL/kg) [Blood:5]  Net: 1995  Weight: 88.5 kg        Estimated Blood Loss: 5 mL       Specimens to Pathology:  ID Type Source Tests Collected by Time Destination   A : Pilonidal cyst TISSUE Tissue SURGICAL PATHOLOGY Davis Gourd, RN 02/01/2019 1842           Temporary Implants:        Packing:                 Patient Condition: good       Findings (Including unexpected complications): Drain left in subcutaneous space.  Dressing: xeroform strip over incision, cover sponge, tegaderm.     Signed:  Tyler Deis, MD  on 02/01/2019 at 8:05 PM

## 2019-02-01 NOTE — H&P (Signed)
UPDATES TO PATIENT'S CONDITION on the DAY OF SURGERY/PROCEDURE    I. Updates to Patient's Condition (to be completed by a provider privileged to complete a H&P, following reassessment of the patient by the provider):    Day of Surgery/Procedure Update:  History  History reviewed and no change    Physical  Physical exam updated and no change     gluteal cleft marked for operation        II. Procedure Readiness   I have reviewed the patient's H&P and updated condition. By completing and signing this form, I attest that this patient is ready for surgery/procedure.    III. Attestation   I have reviewed the updated information regarding the patient's condition and it is appropriate to proceed with the planned surgery/procedure.    I have reminded Ms. Henigan that COVID-19 is still present in our community. She was advised that [facility] and its affiliates have made deliberate and widespread changes to policies and procedures, consistent with applicable directives, in order to reduce the risk of exposure in our facilities.     I further explained and Ms. Porath understands that given the communicability of the SARS CoV2 coronavirus, there remains a small but real risk of contracting the disease while receiving perioperative care - even with stringent preventive measures in place.   Ms. Stallone understands the potential consequences of COVID-19 disease as it relates to their planned procedure and anticipated postoperative course.      The patient and I have considered and discussed the relative risks and benefits of proceeding with his/her surgery - both in terms of the procedure itself, and also in the context of the ongoing pandemic.  Ms. Alicia wishes to proceed with the procedure.     Thornton Park, MD as of 10:51 AM 02/01/2019

## 2019-02-01 NOTE — Discharge Instructions (Signed)
[facility] INSTRUCTIONS      DATE: 02/01/2019         PROCEDURE: Pilonidal cyst removal                SURGEON: Thornton Park, MD     FOR AMBULATORY SURGICAL PATIENTS:  You have received sedative medication and/or general anesthesia which may make you drowsy for as long as 24 hours.     A.) DO NOT drive or operate any machinery for 24 hours    B.) DO NOT drink alcoholic beverages for 24 hours    C.) DO NOT make major decisions, sign contracts, etc. for 24 hours    DIET: Resume your previous diet    ACTIVITY:   - No pressure or sitting on the wound, especially for long periods of time. Try to lie on your side as much as possible.   - No strenuous exercise.  No lifting over 10 lbs. No pushing/pulling. No doing any exercise that will stretch your incision. It is okay to gradually increase your walking as tolerated and climb stairs.    CARE OF DRESSING OR INCISION:   - Keep your dressing in place until tomorrow afternoon.  - You should apply bacitracin ointment to your incision at least two times a day. If dressing becomes dislodged, you may apply more ointment throughout the day. Place a cover sponge (3x4 piece of gauze) over the incision and the drain site, and then apply tape.   - You should also tape the drain to your abdomen or side, to prevent pulling at it. This drain is extremely important in the healing of your wound. It should not become pulled out accidentally or dislodged.  - You should keep the drain compressed. If you notice it fill up with air, please compress it again as you were instructed in the hospital.    MEDICATIONS:  - You should take stool softeners if needed to prevent constipation and straining to have a bowel movement because that can cause pressure and stretch on your incision and affect its healing. Colace has been prescribed, and you can continue to take miralax.  - You have also been prescribed Augmentin. This is an antibiotic. You will take it for 7  total days.  - The ointment has been prescribed (with 1 refill).    BATHING/SHOWERING: You may shower in two days.  No tub bathing.  No soaking your bottom/the incision in water at all.  Do not rub your incision. Pat all of your incisions dry.  No lotions or creams on your incisions.    PAIN MANAGEMENT RECOMMENDATION: You may take acetaminophen and ibuprofen as needed. Alternating these two medicines on a schedule can be very helpful for your pain. You have also been given a narcotic pain prescription, which should only be taken if these over the counter pain medications do not work. Please note that some narcotic pain medicines also include acetaminophen and you should not take more than 3000mg  in 24 hours. Narcotics can be constipating.  It will benefit you to take Colace (prescribed), Senna, or Miralax which are over the counter stool softeners.    SMOKING: Smoking can reduce the quality of your wound healing and increase your chances of wound infections, as well as increase your chances of developing chronic health problems or worsen conditions you already have. If you smoke, you should quit. Smoking cessation information is available for your review to help you quit. Medications to help you quit are available. Ask  your doctor Mady Gemma, Shanon Brow, MD) if you would like to receive these medications.    DIABETES: If you are diabetic, check your blood sugar at least daily. Poor blood sugar control can lead to delayed wound healing. Follow up with your doctor Mady Gemma, Shanon Brow, MD) to discuss your diabetic care regimen.    ASPIRIN: DO NOT take aspirin until the 5th (fifth) day after your surgery.     Call Dr. Thornton Park, MD for the following problems: FEVER over 101 F/38.4 C; PAIN not relieved with pain medication ordered; SWELLING, REDNESS at incision site, heavy BLEEDING, foul DRAINAGE. If your drain falls out.            Drain Care Instructions    Drain Care:    There is a stitch securing the drain. This will be removed  when the drain is removed. If you develop drainage around the drain, keep a clean dry gauze dressing over the site. Call if you have a lot of drainage.    Milk or strip your drains a few times a day or when it looks clogged. Use one hand to tightly secure the drain where it enters your skin. Use the other hand's forefinger and thumb to squeeze the tubing and pull towards the bulb. If the tubing is long you may have to do this a few times along the length of the tubing. You should see the contents of the tubing get squeezed into the bulb.   Empty the bulbs 1-2 times a day. If it gets full more often, empty whenever it gets full. Make note of the amount of drainage every time you empty the drain. Wash your hands before and after emptying the drain. To empty the drain, open the plug and carefully pour the contents into a measuring cup. Squeeze the bulb and close the plug. The drain should look like it does while squeezing it (like it has a dent in it). Record the total daily drainage amount in the chart below. Bring this paper to your follow-up appointment. The drain will be removed once the amount of drainage is less than 30cc (about 1 ounce) every 24 hours for at least 2 consecutive days.   You may have been prescribed antibiotics in order to prevent infections. Please take the entire course of prescribed antibiotics.    Potential Problems (Troubleshooting):   The drain does not stay compressed: The drain should never look round and smooth (like a hand grenade) except when the plug is open while emptying the drain. If it does look round and smooth, it is either full, the plug is open, or there is a leak in the system. Try emptying and resealing the bulb. If it still becomes round and smooth, call your doctors office (this is not an emergency; it can wait until working hours).    The drain suddenly stops draining or starts leaking around the drain site onto the bandage: The drainage should slowly taper off. If  the drain suddenly stops collecting fluid or starts leaking at the drain site, it may be clogged. If there is an obvious blockage in the tubing, you may try to gently dislodge the blockage by squeezing and milking the tubing. Be careful not to pull on the drain as this will pull at the stitch holding it in place and will hurt. If you are unable to dislodge the blockage or you do not see any obvious blockage, call your doctors office (This is not an emergency. It can wait until  working hours.)      When to call:   Call if you have bleeding, severe pain, swelling, redness around the drain site, or fever.   Call if the drain is not working properly and you have not been able to fix the problem using the instructions above.   Your drain output should look clear yellow to dark pink. Please note it and call if the color is greenish, gray, or dark red.   Call if you have any questions.    Drain output worksheet:     Date:                 Drain AM                  PM                  Total

## 2019-02-01 NOTE — Anesthesia Postprocedure Evaluation (Signed)
Anesthesia Post-Op Note    Patient: Linda Oneal    Procedure(s) Performed:  Procedure Summary  Date:  02/01/2019 Anesthesia Start: 02/01/2019  5:45 PM Anesthesia Stop: 02/01/2019  8:05 PM Room / Location:  [id number] / [facility]   Procedure(s):  EXCISION, PILONIDAL CYST Diagnosis:  Pilonidal cyst with abscess [L05.01] Surgeon(s):  Thornton Park, MD  Tyler Deis, MD Attending Anesthesiologist:  Janne Napoleon, MD         Recovery Vitals  BP: (!) 99/37 (02/01/2019  8:05 PM)  Heart Rate: 98 (02/01/2019  8:05 PM)  Heart Rate (via Pulse Ox): 97 (02/01/2019  8:05 PM)  Resp: 18 (02/01/2019  8:05 PM)  Temp: 36.4 C (97.5 F) (02/01/2019  8:05 PM)  SpO2: 100 % (02/01/2019  8:05 PM)  O2 Flow Rate: 10 L/min (02/01/2019  8:05 PM)   0-10 Scale: 0 (02/01/2019  8:06 AM)    Anesthesia type:  general  Complications Noted During Procedure or in PACU:  None   Comment:    Patient Location:  PACU  Level of Consciousness:    Recovered to baseline, awake, alert and oriented  Patient Participation:     Able to participate  Temperature Status:    Normothermic  Oxygen Saturation:    Within patient's normal range  Cardiac Status:   Within patient's normal range  Fluid Status:    Stable  Airway Patency:     Yes  Pulmonary Status:    Baseline  Pain Management:    Adequate analgesia  Nausea and Vomiting:  None    Post Op Assessment:    Tolerated procedure well and no evidence of recall"   Attending Attestation:  All indicated post anesthesia care provided         Complications Noted During Recovery Period:      None  Recovery from Anesthesia:      Recovered to baseline level of consciousness  Condition of patient:      Satisfactory

## 2019-02-01 NOTE — Progress Notes (Signed)
Discussed with patient there is a significant delay in OR - reviewed options  Reschedule or continue with operation as planned - she stated she would like to continue as planned.  Discussed that while she's waiting if she changes her mind that's ok as well.    Thornton Park, MD, Allegra Grana, [place]  Electronically Signed 02/01/2019 12:27 PM    Clinical Assistant Professor of Surgery, [facility]  Attending Surgeon, [facility]  Division of [facility]  [address], [place] [address]  Office: [phone]  Cell: [phone]  Fax: [phone]

## 2019-02-01 NOTE — Progress Notes (Signed)
Called Linda Oneal - [phone] - her ride home and the person she wanted me to call to review events of operation    Thornton Park, MD, Allegra Grana, [place]  Electronically Signed 02/01/2019 8:16 PM    Clinical Assistant Professor of Surgery, [facility]  Attending Surgeon, [facility]  [address]  [place], [place] [address]  Office: [phone]  Cell: [phone]  Fax: [phone]

## 2019-02-01 NOTE — Anesthesia Preprocedure Evaluation (Addendum)
Anesthesia Pre-operative History and Physical for Linda Oneal  History and Physical Performed at [facility] ([facility] [place])      .  CPM Summary:  Linda Oneal presents preoperatively for anesthesia evaluation prior to pilonidal cyst no personal or FHX of anesthesia problems, has answered no to all peri op ekg screening questions. She  has a past medical history of Hypertension, Obesity and pilonidal cyst.  By Johnella Moloney, PA at 8:10 AM on 02/01/2019    Anesthesia Evaluation Information Source: patient, records     ANESTHESIA HISTORY  Pertinent(-):  No History of anesthetic complications or Family hx of anesthetic complications    GENERAL    + Obesity  Pertinent (-):  No substance abuse, history of anesthetic complications or Family Hx of Anesthetic Complications    HEENT    + Visual Impairment          corrective lens for ADL  Pertinent (-):  No neck pain PULMONARY  Pertinent(-):  No smoking, shortness of breath, recent URI or cough/congestion    CARDIOVASCULAR  Good(4+METs) Exercise Tolerance    + Hypertension          well controlled  Pertinent(-):  No orthopnea    Comment: Walks dog , can do housework and climb one flight of stairs without SOB    GI/HEPATIC/RENAL    NPO Status  NPO    > 8hrs ago (solids) and > 2hrs ago (clears)  Pertinent(-):  No GERD, PUD, nausea, vomiting, liver  issues, bowel issues, renal issues or urinary issues  NEURO/PSYCH    + Psychiatric Issues          depression  Pertinent(-):  No dizziness/motion sickness, syncope, seizures or cerebrovascular event    ENDO/OTHER    + Menstruating          irregularly  Pertinent(-):  No diabetes mellitus, thyroid disease    HEMATOLOGIC  Pertinent(-):  No bruising/bleeding easily or coagulopathy         Physical Exam    Airway            Mouth opening: normal            Mallampati: II            TM distance (fb): >3 FB            Neck ROM: full            Airway Impression: easy  Dental   Normal Exam   Cardiovascular           Rhythm: regular            Rate: normal  No carotid bruit        General Survey    No rashes   Pulmonary     breath sounds clear to auscultation    No rhonchi, decreased breath sounds, wheezes, rales    Mental Status     oriented to person, place and time       ________________________________________________________________________  PLAN  ASA Score  2  Anesthetic Plan general     Induction (routine IV) General Anesthesia/Sedation Maintenance Plan (inhaled agents);  Airway Manipulation (direct laryngoscopy); Airway (cuffed ETT); Line ( use current access); Monitoring (standard ASA); Positioning (prone); PONV Plan (dexamethasone and ondansetron); Pain (per surgical team); PostOp (PACU)    Informed Consent     Risks:        Risks discussed were commensurate with the plan listed above with the following specific points: N/V and  sore throat, Damage to: unexpected serious injury.    Anesthetic Consent:         Anesthetic plan (and risks as noted above) were discussed with patient    Attending Attestation:  As the primary attending anesthesiologist, I attest that the patient or proxy understands and accepts the risks and benefits of the anesthesia plan. I also attest that I have personally performed a pre-anesthetic examination and evaluation, and prescribed the anesthetic plan for this particular location within 48 hours prior to the anesthetic as documented. Janne Napoleon, MD 5:13 PM

## 2019-02-01 NOTE — Anesthesia Procedure Notes (Signed)
---------------------------------------------------------------------------------------------------------------------------------------    AIRWAY   GENERAL INFORMATION AND STAFF    Patient location during procedure: OR       Date of Procedure: 02/01/2019 6:10 PM  CONDITION PRIOR TO MANIPULATION     Current Airway/Neck Condition:  Normal        For more airway physical exam details, see Anesthesia PreOp Evaluation  AIRWAY METHOD     Patient Position:  Sniffing    Preoxygenated: yes      Induction: IV  Mask Difficulty Assessment:  0 - not attempted      Technique Used for Successful ETT Placement:  Direct laryngoscopy    Devices/Methods Used in Placement:  Intubating stylet    Blade Type:  Miller    Laryngoscope Blade/Video laryngoscope Blade Size:  2    Cormack-Lehane Classification:  Grade I - full view of glottis    Placement Verified by: capnometry, auscultation and equal breath sounds      Number of Attempts at Approach:  1    Number of Other Approaches Attempted:  0  FINAL AIRWAY DETAILS    Final Airway Type:  Endotracheal airway    Final Endotracheal Airway:  ETT      Cuffed: cuffed    Insertion Site:  Oral    ETT Size (mm):  7.5    Distance inserted from Teeth (cm):  22  ----------------------------------------------------------------------------------------------------------------------------------------

## 2019-02-01 NOTE — Anesthesia Case Conclusion (Signed)
CASE CONCLUSION  Emergence  Actions:  Suctioned and extubated  Criteria Used for Airway Removal:  Adequate Tv & RR, acceptable O2 saturation and following commands  Transport  Directly to: PACU  Oxygen Delivery:  10 lpm  Position:  Recumbent  Patient Condition on Handoff  Level of Consciousness:  Mildly sedated  Patient Condition:  Stable  Handoff Report to:  RN

## 2019-02-01 NOTE — Op Note (Signed)
[facility]  Clayton Lefort, M.D.     Donella Stade, M.D.     Richrd Humbles, M.D.       Fransisca Kaufmann, M.D.     Drucie Ip, M.D.     Harrietta Guardian, M.D.       Thornton Park, M.D.     Dalphine Handing, M.D.     Charna Busman, M.D.        Operative Report        PATIENT:   Linda Oneal, Linda Oneal MR #:  [id number]   CSN:  [id number] DOB:  July 17, 1985    AGE:  33     SURGEON:  Thornton Park, MD  ASSISTANT:  Scherrie Gerlach, MD (RES)  SURGERY DATE:  02/01/2019    PREOPERATIVE DIAGNOSIS:  Pilonidal cyst with abscess.    POSTOPERATIVE DIAGNOSIS:  Pilonidal cyst with abscess.    OPERATIVE PROCEDURE:  Bascom II - Cleft Lift Procedure    ANESTHESIA:  General.    FLUIDS IN:  2 L of crystalloid.    ESTIMATED BLOOD LOSS:  5 cc.    SPECIMEN:  Pilonidal cyst.    TEMPORARY IMPLANTS:  7-French drain.    PACKING:  None.    PATIENT CONDITION:  Good upon transfer to recovery area.    FINDINGS:  Drain left in subcutaneous space.    DRESSINGS:  Xeroform strip over incision, gauzes and Tegaderm.    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  Discussed options in the office and patient chose to undergo Bascom II (Cleft Lift Procedure).  Risks, benefits, alternatives of procedure were discussed with the patient including, but not limited to, bleeding, infection, breakdown of wound, prolonged wound healing, need for further procedures, pain, benefit to excise the pilonidal cyst and prevent further symptoms from this, alternative of no procedure or to have a Psychiatric nurse as a Optometrist.    DESCRIPTION OF PROCEDURE:  Patient identified in the preop holding by patient, site and procedure.  Consent was obtained.  Patient was brought to the operating room and placed in a supine position.  The patient was intubated and then placed into prone jack-knife position with adequate padding, to ensure that the arm and shoulders were safe, and  rotated forward to ensure that there would be no dislocation of the shoulder joints.  The table was then placed into the jack-knife position with the buttocks up, and these were then secured with Mastisol and taped apart.  I had marked her buttocks in the preop holding area to check the location for my excision.  The gluteal cleft was then prepped with Betadine base-prep and then draped.  A time-out was performed.  Patient and type of procedure was confirmed.      I had made the markings preoperatively with a plan to make a left-sided excision to excise out the opening of the pilonidal cyst tract and leaving the skin flap coming from the right side.  This was opened up with a 15-blade and then Bovie cautery set on cut mode to open up the dermis and then taking the skin edge and lifting this up and excising it off the field.  The tract itself was then cleaned of any hypergranulation tissue leaving the rind of the scar tissue below.  This was broken apart using the cautery in hash style to allow for a suture closure.  The skin flap was then made by  starting with the skin edge and extending it to the left lateral side, using skin hooks to lift up to minimize trauma to the skin itself and then using cautery to go through the fatty tissue.      Once that was done, the deep layer was closed with a 2-0 Vicryl in a horizontal mattress fashion.  Then, we turned ourself to the next layer, bringing the flap of with sutures securing it to help close the dead space with interrupted 3-0 Vicryl.  We placed a drain through the left side into this space and secured it with two 2-0 silk sutures and then later on with a #2-0 PDS suture to reinforce those silks.  We made sure that the holes of the drain were inside the wound and secured in place and that the sutures used to close the wound did not incorporate the drain.  After that, we reapproximated the rest of the skin flap, taking the deep dermal sutures and taking a bite of the  deep portion of the wound to help close the dead space in interrupted fashion until that whole area was airtight using 3-0 Vicryl pop-off sutures.      Then, using combination of 3-0 PDS and 3-0 Monocryl, placed vertical mattress sutures of the skin itself to close the rest of the wound.  Once that was completed, Xeroform was placed on top of the wounds, dry dressing was placed on top of this, as well as on the drain site and then Tegaderm was placed on top of that.  The drain itself was then secured around the flank up to the front.  Patient was flipped back into supine position and extubated, returned to recovery room in stable condition.  All needles and sponge counts were correct at the end of procedure x 2.    I then called the friend of the patient, Demetrius, as per patient's request and reviewed the events of the operation.  All questions were answered to his apparent satisfaction.                 ______________________________  Thornton Park, MD    BT/MODL  DD:  02/01/2019 20:29:03  DT:  02/01/2019 22:39:58  Job #:  [id number]    cc:  Parke Simmers, MD

## 2019-02-06 ENCOUNTER — Encounter: Payer: Self-pay | Admitting: Colon and Rectal Surgery

## 2019-02-06 LAB — SURGICAL PATHOLOGY

## 2019-04-03 ENCOUNTER — Other Ambulatory Visit: Payer: Self-pay

## 2019-04-03 DIAGNOSIS — Z20822 Contact with and (suspected) exposure to covid-19: Secondary | ICD-10-CM

## 2019-04-04 LAB — NOVEL CORONAVIRUS, NAA: SARS-CoV-2, NAA: NOT DETECTED

## 2019-05-29 ENCOUNTER — Other Ambulatory Visit
Admission: RE | Admit: 2019-05-29 | Discharge: 2019-05-29 | Disposition: A | Discharge status: Home / Self Care | Payer: Medicaid Other | Source: Ambulatory Visit | Attending: Anatomic Pathology & Clinical Pathology | Admitting: Anatomic Pathology & Clinical Pathology

## 2019-05-29 DIAGNOSIS — D128 Benign neoplasm of rectum: Secondary | ICD-10-CM | POA: Insufficient documentation

## 2019-05-29 DIAGNOSIS — D125 Benign neoplasm of sigmoid colon: Secondary | ICD-10-CM | POA: Insufficient documentation

## 2019-06-05 LAB — SURGICAL PATHOLOGY

## 2019-07-22 ENCOUNTER — Other Ambulatory Visit: Payer: Self-pay

## 2019-07-22 ENCOUNTER — Ambulatory Visit: Payer: Self-pay | Attending: Internal Medicine

## 2019-07-22 DIAGNOSIS — Z20822 Contact with and (suspected) exposure to covid-19: Secondary | ICD-10-CM | POA: Insufficient documentation

## 2019-07-23 LAB — NOVEL CORONAVIRUS, NAA: SARS-CoV-2, NAA: NOT DETECTED

## 2019-08-20 ENCOUNTER — Encounter: Payer: Self-pay | Admitting: Primary Care

## 2019-08-20 ENCOUNTER — Telehealth: Payer: Self-pay

## 2019-08-20 ENCOUNTER — Ambulatory Visit: Payer: Medicaid Other | Attending: Primary Care | Admitting: Primary Care

## 2019-08-20 VITALS — BP 170/89 | HR 134 | Temp 102.9°F | Ht 62.0 in | Wt 200.0 lb

## 2019-08-20 DIAGNOSIS — J020 Streptococcal pharyngitis: Secondary | ICD-10-CM | POA: Insufficient documentation

## 2019-08-20 DIAGNOSIS — Z20822 Contact with and (suspected) exposure to covid-19: Secondary | ICD-10-CM | POA: Insufficient documentation

## 2019-08-20 DIAGNOSIS — R509 Fever, unspecified: Secondary | ICD-10-CM | POA: Insufficient documentation

## 2019-08-20 DIAGNOSIS — J029 Acute pharyngitis, unspecified: Secondary | ICD-10-CM | POA: Insufficient documentation

## 2019-08-20 DIAGNOSIS — I10 Essential (primary) hypertension: Secondary | ICD-10-CM | POA: Insufficient documentation

## 2019-08-20 DIAGNOSIS — Z20828 Contact with and (suspected) exposure to other viral communicable diseases: Secondary | ICD-10-CM | POA: Insufficient documentation

## 2019-08-20 LAB — POCT AMBULATORY RAPID STREP
Lot #: 201056
Rapid Strep Group A Throat-POC: POSITIVE

## 2019-08-20 MED ORDER — AMOXICILLIN-POT CLAVULANATE 875-125 MG PO TABS *I*
1.0000 | ORAL_TABLET | Freq: Two times a day (BID) | ORAL | 0 refills | Status: AC
Start: 2019-08-20 — End: 2019-08-30

## 2019-08-20 NOTE — Telephone Encounter (Signed)
Patient seen today  Request [facility] script sent to  [facility] [place] [facility] not [facility] insurance     It is for medication Augmentin

## 2019-08-20 NOTE — Telephone Encounter (Signed)
RN called [facility] and gave a VO for Augmentin rx written earlier and sent to [place]. Per pt phone call to office this evening is not accepted there d/t insurance. Informed pt of this, verbalizes understanding.

## 2019-08-20 NOTE — Progress Notes (Signed)
SUBJECTIVE    CHIEF COMPLAINT:   Chief Complaint   Patient presents with    Pharyngitis     these symptoms started  yesterday     Generalized Body Aches    Ear Problem    Headache       HPI:   Linda Oneal is a 34 y.o. female who presents for the following:    1. Sore Throat, fever  - Started with HA yesterday all day, overnight started havin sore throat  - Woke up this am with hot flashes and generalized soreness  - Did lok in her throat and saw some white spots in the back  - Having some ear discomfort, no nasal congestion or rhinorrhe  - Started 1 days ago  - Symptoms are worsening  - Other symptoms: + fevers, + chills, no arthralgias, + myalgias  - + Nausea, no vomiting, no diarrhea  - No known recent sick contacts or people with known COVID testing   - No recent travel to places that have high rates of COVID   - Has tried tylenol with some improvement, did not take anything today  - Pt has not been eating/drinking today  - Minimal fluids today, but cannot d/t sore throat  - Not sure about loss of taste/smell sensation- does not think she has lost smell sensation  - Has 4 kids at home they all are well  - Denies cough, has not noticed swollen LN in neck    Current Outpatient Medications   Medication Sig    etonogestrel (NEXPLANON) 68 MG IMPL Inject 68 mg into the skin once    oxyCODONE (ROXICODONE) 5 MG immediate release tablet Take 1 tablet (5 mg total) by mouth every 4-6 hours as needed for Pain for up to 35 doses Max daily dose: 30 mg (Patient not taking: Reported on 08/20/2019)    amLODIPine (NORVASC) 5 MG tablet Take 1 tablet (5 mg total) by mouth daily    polyethylene glycol (GLYCOLAX) powder Take 17 g by mouth daily Mix in 8 oz water or juice and drink. (Patient not taking: Reported on 08/20/2019)       ROS As above.    OBJECTIVE    BP 170/89 (BP Location: Right arm, Patient Position: Sitting, Cuff Size: large adult)    Pulse (!) 134    Temp (!) 39.4 C (102.9 F) (Temporal)    Ht 1.575 m (5\' 2" )  Comment: stated   Wt 90.7 kg (200 lb) Comment: with boots   SpO2 99% Comment: room air   BMI 36.58 kg/m     General: well-appearing female, NAD, pleasant  Head: NCAT  EENT: MMM, PERRLA, pharynx is moderately erythematous but with no visible exudates, TM are normal  Neck: Supple, trachea midline, tender submental LAD, no thyromegaly  Heart: Regular rhythm, mildly tachycardic on exam, w/out MRG, S1, S2  Lungs: CTAB, good air entry to bases b/l, no crackles, wheezes or rhonchi, normal respiratory effort  Neuro: AAOx3  Skin: skin warm and dry, no rashes noted   Psych: no anxiety, affect is appropriate, good insight and judgement      All past labs and imaging were individually reviewed by myself.     ASSESSMENT & PLAN    Linda Oneal is a 34 y.o. female presenting to walk in clinic with acute complaints of sore throat and fever with plan as below    1. Sore throat, Fever  Acute, not controlled. DDX: viral vs. Bacterial URI, pharyngitis,  seasonal allergies, bronchitis.   -  COVID PCR nasal swab obtained, RVP obtained as well  - Recommend pt quarantine until test results return, note provided for work  -  Rapid strep positive  - Anticipatory guidance given to patient in form of hand out with updated CDC guidelines   - Tylenol PRN   - Flonase daily for nasal congestion  - Continue hydration and supportive care  - COVID-19 PCR; Future  - POCT Ambulatory Rapid Strep  - Influenza A PCR; Future  - Influenza B PCR; Future  - RSV PCR; Future  - RSV PCR  - Influenza B PCR  - Influenza A PCR  - COVID-19 PCR    2. Strep throat  - Acute problem, not controlled  - POCT rapid strep positive, will treat with course of augmentin  - Precautions reviewed carefully with pt  - Pt may also try supportive care and/or throat spray/cough syrup  - Encouraged hydration- she is tachycardic but this may be d/t her current fever and likely some dehydration; advised regular tylenol use and increased hydration  - amoxicillin-clavulanate  (AUGMENTIN) 875-125 MG per tablet; Take 1 tablet by mouth 2 times daily for 10 days  Dispense: 20 tablet; Refill: 0    3. HTN:  - Chronic problem, not controlled  - BP is also elevated, may be related to ongoing illness as discussed above, but pt is not taking amlodipine currently; advised her to monitor BP and discuss with CCP when she is feeling better- may need to restart this      Patient verbalized understanding of information presented,and confirmed agreement with current plan of care. Reviewed risks, benefits, and administration of prescribed/refilled medications. Precautions reviewed.  Encouraged to contact me / office with any questions or concerns.      Follow up in PRN for treatment re evaluation of conditions listed above.     Betsey Holiday DO  08/20/2019  at: 3:52 PM   Family Medicine Attending  [place] [phone]

## 2019-08-20 NOTE — Patient Instructions (Signed)
Information on COVID   Please note that information for COVID is evolving rapidly; information given during today's visit may be out of date in as little as days to a week   Some of the following information below has not changed and can be used at all times     Know how it spreads   The best way to prevent illness is to avoid being exposed to this virus.   The virus is thought to spread mainly from person-to-person.     Who Should Quarantine?   People who have been in close contact with someone who has COVID-19--excluding people who have had COVID-19 within the past 3 months.   People who have tested positive for COVID-19 do not need to quarantine or get tested again for up to 3 months as long as they do not develop symptoms again. People who develop symptoms again within 3 months of their first bout of COVID-19 may need to be tested again if there is no other cause identified for their symptoms.   What counts as close contact?  ; You were within 6 feet of someone who has COVID-19 for a total of 15 minutes or more  ; You provided care at home to someone who is sick with COVID-19  ; You had direct physical contact with the person (hugged or kissed them)  ; You shared eating or drinking utensils  ; They sneezed, coughed, or somehow got respiratory droplets on you    When to start and end quarantine   You should stay home for 14 days after your last contact with a person who has COVID-19.   For all of the following scenarios, even if you test negative for COVID-19 or feel healthy, you should stay home (quarantine) since symptoms may appear 2 to 14 days after exposure to the virus.    The Holidays are around the corner, what should I do?  Do not host or participate in any in-person gatherings if you or anyone in your household  ; Has been diagnosed with COVID-19 and has not met the criteria for when it is safe to be around others  ; Has symptoms of COVID-19  ; Is waiting for COVID-19 viral test  results  ; May have been exposed to someone with COVID-19 in the last 14 days  ; Is at increased risk of severe illness from COVID-19    Do not host or attend gatherings with anyone who has COVID-19 or has been exposed to someone with COVID-19 in the last 14 days.    People at increased risk for severe illness  If you are an older adult or person with certain medical conditions who is at increased risk of severe illness from COVID-19, or live or work with someone at increased risk of severe illness, you should avoid in-person gatherings with people who do not live in your household.    Considerations for Hosting or Attending a Gathering  If you will be hosting a gathering during the holiday season that brings people who live in different households together, follow CDC tips for [employer]. If you will be attending a gathering that someone else is hosting, follow CDC Considerations for Events and Gatherings. Below are some general considerations for hosting a gathering that brings together people from different households. Guests should be aware of these considerations and ask their host what mitigation measures will be in place during the gathering. Hosts should consider the following:  ; Check the COVID-19  infection rates in areas where attendees live on state, local, territorial, or tribal health department websites. Based on the current status of the pandemic, consider if it is safe to hold or attend the gathering on the proposed date.  ; Limit the number of attendees as much as possible to allow people from different households to remain at least 6 feet apart at all times. Guests should avoid direct contact, including handshakes and hugs, with others not from their household.  ; Host outdoor rather than indoor gatherings as much as possible. Even outdoors, require guests to wear masks when not eating or drinking.  ; Avoid holding gatherings in crowded, poorly ventilated spaces with persons who are not in  your household.  ; Increase ventilation by opening windows and doors to the extent that is safe and feasible based on the weather, or by placing central air and heating on continuous circulation.  ? Winter weather can be cold, wet, and unpredictable. Inclement weather makes it difficult to increase ventilation by opening windows or to hold an event outdoors.  ; If setting up outdoor seating under a pop-up open air tent, ensure guests are still seated with physical distancing in mind. Enclosed 4-wall tents will have less air circulation than open air tents. If outdoor temperature or weather forces you to put up the tent sidewalls, consider leaving one or more sides open or rolling up the bottom 12” of each sidewall to enhance ventilation while still providing a wind break.  ; Require guests to wear masks. At gatherings that include persons of different households, everyone should always wear a mask that covers both the mouth and nose, except when eating or drinking. It is also important to stay at least 6 feet away from people who are not in your household at all times.  ; Encourage guests to avoid singing or shouting, especially indoors. Keep music levels down so people don’t have to shout or speak loudly to be heard.  ; Encourage attendees to wash their hands often with soap and water for at least 20 seconds. If soap and water are not readily available, use hand sanitizer that contains at least 60% alcohol.  ; Provide guests information about any COVID-19 safety guidelines and steps that will be in place at the gathering to prevent the spread of the virus.  ; Provide and/or encourage attendees to bring supplies to help everyone to stay healthy. These include extra masks (do not share or swap with others), hand sanitizer that contains at least 60% alcohol, and tissues. Stock bathrooms with enough hand soap and single use towels.  ; Limit contact with commonly touched surfaces or shared items such as serving  utensils.  ; Clean and disinfect commonly touched surfaces and any shared items between use when feasible. Use EPA-approved disinfectantsexternal icon?.  ; Use touchless garbage cans if available. Use gloves when removing garbage bags or handling and disposing of trash. Wash hands after removing gloves.  ; Plan ahead and ask guests to avoid contact with people outside of their households for 14 days before the gathering.  ; Treat pets as you would other human family members - do not let pets interact with people outside the household.    The more of these prevention measures that you put in place, the safer your gathering will be. No one measure is enough to prevent the spread of COVID-19.    Food and drinks at small holiday gatherings  Currently, there is no evidence to suggest that   handling food or eating is associated with directly spreading COVID-19. It is possible that a person can get COVID-19 by touching a surface or object, including food, food packaging, or utensils that have the virus on it and then touching their own mouth, nose, or possibly their eyes. However, this is not thought to be the main way that the virus is spread. Remember, it is always important to follow food safety practices to reduce the risk of illness from common foodborne germs.  ; Encourage guests to bring food and drinks for themselves and for members of their own household only; avoid potluck-style gatherings.  ; Wear a mask while preparing food for or serving food to others who don’t live in your household.  ; All attendees should have a plan for where to store their mask while eating and drinking. Keep it in a dry, breathable bag (like a paper or mesh fabric bag) to keep it clean between uses.  ; Limit people going in and out of the areas where food is being prepared or handled, such as in the kitchen or around the grill, if possible.  ; Have one person who is wearing a mask serve all the food so that multiple people are not  handling the serving utensils.  ; Use single-use options or identify one person to serve sharable items, like salad dressings, food containers, plates and utensils, and condiments.  ; Make sure everyone washes their hands with soap and water for 20 seconds before and after preparing, serving, and eating food and after taking trash out. Use hand sanitizer that contains at least 60% alcohol if soap and water are not available.  ; Designate a space for guests to wash hands after handling or eating food.  ; Limit crowding in areas where food is served by having one person dispense food individually to plates, always keeping a minimum of a 6-foot distance from the person whom they are serving. Avoid crowded buffet and drink stations. Change and launder linen items (e.g., seating covers, tablecloths, linen napkins) immediately following the event.  ; Offer no-touch trash cans for guests to easily throw away food items.  ; Wash dishes in the dishwasher or with hot soapy water immediately following the gathering.    Travel and Overnight Stays  If you decide to travel, follow these safety measures during your trip to protect yourself and others from COVID-19:  ; Wear a mask in public settings, like on public and mass transportation, at events and gatherings, and anywhere you will be around other people.  ; Avoid close contact by staying at least 6 feet apart (about 2 arm lengths) from anyone who is not from your household.  ; Wash your hands often with soap and water for at least 20 seconds or use hand sanitizer (with at least 60% alcohol).  ; Avoid contact with anyone who is sick.  ; Avoid touching your face mask, eyes, nose, and mouth.    Travel increases the chance of getting and spreading the virus that causes COVID-19. Staying home is the best way to protect yourself and others. Use information from the following webpages to decide whether to travel during the holidays:  Considerations for traveling  overnight    Consider whether you, someone you live with, or anyone you plan to visit with is at increased risk for severe illness from COVID-19, to determine whether to stay overnight in the same residence or to stay elsewhere.  ; Assess risk for infection based on   how you or your visitor will travel.  ; Consider and prepare for what you will do if you, or someone else, becomes sick during the visit. What are the plans for isolation, medical care, basic care, and travel home?    Tips for staying overnight or hosting overnight guests  ; Visitors should launder clothing and mask, and stow luggage away from common areas upon arrival.  ; Wash hands with soap and water for at least 20 seconds, especially upon arrival.  ; Wear masks while inside the house. Masks may be removed for eating, drinking, and sleeping, but individuals from different households should stay at least 6 feet away from each other at all times.  ; Improve ventilation by opening windows and doors or by placing central air and heating on continuous circulation.  ; Spend time together outdoors. Take a walk or sit outdoors at least 6 feet apart for interpersonal interactions.  ; Avoid singing or shouting, especially indoors.  ; Treat pets as you would other human family members - do not let pets interact with people outside the household.  ; Monitor hosts and guests for symptoms of COVID-19 such as fever, cough, or shortness of breath.  ; Hosts and guests should have a plan for what to do if someone becomes sick.    Where can I get tested?   • Given that there is a predicted surge in cases, the places to get tested for COVID are rapidly changing. Priority should be given to those who are symptomatic. At this time, you can schedule with   o For asymptomatic: [employer] [phone] to schedule an appointment   - For personal health questions, contact:  ; [facility] COVID-19 Support Line:  1-888-928-0011  ; [email or web address]  ; [place] [facility]: [phone]  o Symptomatic: Any Urgent Care or your PCP's office      I keep hearing about a vaccine, can you speak more about it?  • Many COVID-19 vaccine candidates are in development, and clinical trials are being conducted simultaneously with large-scale manufacturing. It is not known which vaccines will be authorized or approved--CDC is planning for many possibilities.  • At this time, it is a wait and see. More information will be released about which vaccinations will be safe and what groups would be recommended to be first to receive this vaccine.   • Please get your flu vaccine in the meantime to help the healthcare system in preventing a surge.

## 2019-08-21 LAB — INFLUENZA B PCR: Influenza B PCR: 0

## 2019-08-21 LAB — COVID-19 PCR

## 2019-08-21 LAB — RSV PCR: RSV PCR: 0

## 2019-08-21 LAB — COVID-19 NAAT (PCR): COVID-19 NAAT (PCR): NEGATIVE

## 2019-08-21 LAB — INFLUENZA A: Influenza A PCR: 0

## 2019-08-22 ENCOUNTER — Telehealth: Payer: Self-pay

## 2019-08-22 NOTE — Telephone Encounter (Signed)
-----   Message from [place], [place] sent at 08/21/2019  5:22 PM EST -----  Hi please let pt know her covid test was negative, as was her viral panel. She doesn't have to quarantine but she may want to stay home until she feels better.    Thanks  Verl Bangs  ----- Message -----  From: Fredia Beets  Sent: 08/20/2019   4:04 PM EST  To: Betsey Holiday, DO

## 2019-08-22 NOTE — Telephone Encounter (Signed)
Writer spoke with pt and informed her of lab results.  Pt was advised to stay home until she feels better.  Pt agreeable.  Pt reports that she is feeling better today.  Her fever has gone down.

## 2019-09-16 ENCOUNTER — Encounter: Payer: Self-pay | Admitting: Student in an Organized Health Care Education/Training Program

## 2019-09-16 ENCOUNTER — Ambulatory Visit: Payer: Medicaid Other | Admitting: Student in an Organized Health Care Education/Training Program

## 2019-09-16 VITALS — BP 145/91 | HR 90 | Temp 97.9°F | Ht 62.01 in | Wt 198.0 lb

## 2019-09-16 DIAGNOSIS — I10 Essential (primary) hypertension: Secondary | ICD-10-CM

## 2019-09-16 DIAGNOSIS — Z800 Family history of malignant neoplasm of digestive organs: Secondary | ICD-10-CM

## 2019-09-16 MED ORDER — HYDROXYZINE HCL 25 MG PO TABS *I*
25.0000 mg | ORAL_TABLET | Freq: Four times a day (QID) | ORAL | 2 refills | Status: DC | PRN
Start: 2019-09-16 — End: 2019-12-18

## 2019-09-16 MED ORDER — AMLODIPINE BESYLATE 5 MG PO TABS *I*
5.0000 mg | ORAL_TABLET | Freq: Every day | ORAL | 5 refills | Status: DC
Start: 2019-09-16 — End: 2019-12-26

## 2019-09-16 NOTE — Patient Instructions (Addendum)
Phone number for ultrasound doppler : [phone]    Walk in clinics and Urgent Care for [facility]  [address]  [place], [place] [address]  [phone]  Open Monday - Friday from 8:00 AM - Midnight. No appointments necessary, walk-ins welcome from 8:00 AM - 10:00 PM   [email or web address]    [facility]  [address]  [place] [address]  [phone]  Same Day Access is available: Walk in Services:  Mondays, Wednesday, Thursdays from 8:30 to 11 am    Tuesdays from 10:30 to 1 pm.   [email or web address]     [facility]  [address] [address]  [phone]  Walk in [place], [place], [place] [address]  [email or web address]     [facility]   Walk in-Thursdays 8-930am  [phone]  [address] [place], [place] [address]  [email or web address]

## 2019-09-16 NOTE — Progress Notes (Signed)
Follow Up Visit    Chief Complaint     Chief Complaint   Patient presents with    Pharyngitis       Subjective     Linda Oneal is a 34 y.o. female here for the following:    HTN  BP monitoring: intermittent, usually elevated  Medication: amlodpine 5mg   HA/blurry vision: sometimes  CP/SOB/Leg swelling:   HTN HM (micoalbumin, BMP):  BP Readings from Last 3 Encounters:   09/16/19 (!) 145/91   08/20/19 170/89   02/01/19 120/69     Lab Results   Component Value Date    MALBR <1.20 06/18/2018   Has never had secondary HTN work up    ROS   Constitutional: denies fever, chills  Respiratory: denies SOB   Cardiac: denies chest pain or palpitations  GI: denies nausea, vomiting  Neuro: denies headache, weakness, numbness    Patient's medications, allergies, past medical, surgical, social and family histories were reviewed and updated as appropriate.    Objective     Vitals:    09/16/19 1607   BP: (!) 145/91   Pulse: 90   Temp: 36.6 C (97.9 F)   Weight: 89.8 kg (198 lb)   Height: 1.575 m (5' 2.01")     BP Readings from Last 3 Encounters:   09/16/19 (!) 145/91   08/20/19 170/89   02/01/19 120/69     Wt Readings from Last 3 Encounters:   09/16/19 89.8 kg (198 lb)   08/20/19 90.7 kg (200 lb)   02/01/19 88.5 kg (195 lb)       General: well-appearing, NAD, pleasant  HEENT: NCAT, sclera nonicteric  Cardio: Regular rate and rhythm; no murmurs, rubs, or gallops  Chest: good air entry to bases b/l; no crackles, wheezes or rhonchi; normal respiratory effort  Abd: bowel sounds present, soft, nontender to palpation, no guarding  Ext: warm, no edema  Skin: skin warm and dry, no rashes noted   Neuro: alert and oriented   Psych: normal mood and affect    Assessment/Plan     1. Hypertension, unspecified type  Initiate work up for secondary causes, continue amlodipine and follow up to evaluate effect  - Microalbumin, Urine, Random; Future  - Basic metabolic panel; Future  - TSH; Future  - T4, free; Future  - US doppler renal  artery and vein; Future  - amLODIPine (NORVASC) 5 MG tablet; Take 1 tablet (5 mg total) by mouth daily  Dispense: 30 tablet; Refill: 5  - AMB REFERRAL TO SLEEP MEDICINE    2. Family hx of colon cancer  Discussed her recommended q3 year colonoscopy for surveillence    Follow-up: Return in about 4 weeks (around 10/14/2019). Will discuss care gaps at that time    Placido Sou. Purity Irmen, MD  Family Medicine, PGY-1  09/20/19  3:14 PM      --------------------    Preceptor Attestation - Patient Seen    I saw and evaluated the patient. I agree with the resident's/fellow's findings and plan of care as documented above.     [place], DO      Of note:  Secondary HTN causes  obstructive sleep apnea (moderate to severe)   hyperaldosteronism   renal parenchymal disease   renal artery stenosis   thyroid disease (hyper- or hypothyroidism)   medications or substances   hypercortisolism (Cushing disease)   pheochromocytoma/paraganglioma   coarctation of aorta    Overview from up to date  up to 95%  of patients with hypertension have essential (also called primary) hypertension, commonly defined as sustained elevation of systemic arterial blood pressure, with systolic blood pressure ? [id number] mm Hg or diastolic blood pressure ? 90 mm Hg   avoidance of improper blood pressure measurement technique is necessary for proper diagnosis   consideration should be given to alternate explanations of observed blood pressures such as white coat hypertension, masked hypertension, pseudohypertension, and/or secondary hypertension   white coat hypertension   defined as elevated blood pressure in office setting but within normal limits (< 140/90 mm Hg) outside office setting   reported prevalence of 15%-30%; more frequent in women, nonsmokers, and older adults   masked hypertension   defined as elevated blood pressure in home setting or with ambulatory blood pressure monitoring but within normal limits in office setting   reported prevalence of 9%-17%;  more frequent in men and young children   secondary hypertension   defined as hypertension due to an identifiable (and often correctable) cause which may include   exogenous causes, such as medications, foods, and substance abuse/intoxicants   medical causes, which may include kidney, endocrine, neurologic, obstetric, posttraumatic, and other diseases/disorders   reported prevalence among adults with hypertension 5%-17%; more frequent in adults ? 34 years old   pseudohypertension   defined as cuff diastolic blood pressure ? 15 mm Hg higher than simultaneously measured intra-arterial blood pressure   reported prevalence of about 4%; typically found in elderly patients with calcified arteries   examples of physiologic causes of increased blood pressure may include anxiety and pain   see also Hypertension for diagnostic considerations from professional organizations

## 2019-10-17 ENCOUNTER — Other Ambulatory Visit
Admission: RE | Admit: 2019-10-17 | Discharge: 2019-10-17 | Disposition: A | Discharge status: Home / Self Care | Payer: Medicaid Other | Source: Ambulatory Visit | Attending: Family Medicine | Admitting: Family Medicine

## 2019-10-17 DIAGNOSIS — I10 Essential (primary) hypertension: Secondary | ICD-10-CM | POA: Insufficient documentation

## 2019-10-17 LAB — BASIC METABOLIC PANEL
Anion Gap: 10 (ref 7–16)
CO2: 26 mmol/L (ref 20–28)
Calcium: 9 mg/dL (ref 8.8–10.2)
Chloride: 104 mmol/L (ref 96–108)
Creatinine: 0.9 mg/dL (ref 0.51–0.95)
GFR,Black: 96 *
GFR,Caucasian: 84 *
Glucose: 133 mg/dL — ABNORMAL HIGH (ref 60–99)
Lab: 10 mg/dL (ref 6–20)
Potassium: 4 mmol/L (ref 3.3–5.1)
Sodium: 140 mmol/L (ref 133–145)

## 2019-10-17 LAB — MICROALBUMIN, URINE, RANDOM
Creatinine,UR: 97 mg/dL (ref 20–300)
Microalbumin,UR: <1.2 mg/dL

## 2019-10-17 LAB — TSH: TSH: 1.33 u[IU]/mL (ref 0.27–4.20)

## 2019-10-17 LAB — T4, FREE: Free T4: 0.9 ng/dL (ref 0.9–1.7)

## 2019-10-25 ENCOUNTER — Ambulatory Visit: Payer: Medicaid Other | Admitting: Student in an Organized Health Care Education/Training Program

## 2019-10-25 ENCOUNTER — Encounter: Payer: Self-pay | Admitting: Student in an Organized Health Care Education/Training Program

## 2019-10-25 VITALS — BP 132/78 | HR 113 | Temp 97.6°F | Ht 62.01 in | Wt 200.0 lb

## 2019-10-25 DIAGNOSIS — I10 Essential (primary) hypertension: Secondary | ICD-10-CM

## 2019-10-25 NOTE — Progress Notes (Addendum)
Follow Up Visit    Chief Complaint     Chief Complaint   Patient presents with    Hypertension     fuv      Subjective     ROSARY MANWELL is a 34 y.o. female here for the following: htn follow up    HTN  Vitals today:   Vitals:    10/25/19 0842   BP: 132/78   Pulse: (!) 113   Temp: 36.4 C (97.6 F)   Weight: 90.7 kg (200 lb)   Height: 1.575 m (5' 2.01")     BP monitoring: home BP cuff  Medication: amlodipine 5mg   HA/blurry vision: intermittent, improved  CP/SOB/Leg swelling: no  HTN HM (micoalbumin, BMP): see below, normal    Lab Results   Component Value Date    MALBR <1.20 10/17/2019         Lab results: 10/17/19  1920   Sodium 140   Potassium 4.0   Chloride 104   CO2 26   UN 10   Creatinine 0.90   GFR,Caucasian 84   GFR,Black 96   Glucose 133*   Calcium 9.0       Health maintenance  []  PHQ9   []  Pap -- follow up AGY for pap  []  COVID vaccine  []  Hepatitis C    ROS   Constitutional: denies fever, chills  Respiratory: denies SOB   Cardiac: denies chest pain or palpitations  GI: denies nausea, vomiting  Neuro: denies headache, weakness, numbness    Patient's medications, allergies, past medical, surgical, social and family histories were reviewed and updated as appropriate.    Objective     Vitals:    10/25/19 0842   BP: 132/78   Pulse: (!) 113   Temp: 36.4 C (97.6 F)   Weight: 90.7 kg (200 lb)   Height: 1.575 m (5' 2.01")     BP Readings from Last 3 Encounters:   10/25/19 132/78   09/16/19 (!) 145/91   08/20/19 170/89     Wt Readings from Last 3 Encounters:   10/25/19 90.7 kg (200 lb)   09/16/19 89.8 kg (198 lb)   08/20/19 90.7 kg (200 lb)     General: well-appearing, NAD, pleasant  HEENT: NCAT, sclera nonicteric  Cardio: Regular rate and rhythm; no murmurs, rubs, or gallops  Chest: good air entry to bases b/l; no crackles, wheezes or rhonchi; normal respiratory effort  Abd: bowel sounds present, soft, nontender to palpation, no guarding  Ext: warm, no edema  Skin: skin warm and dry, no rashes noted      Neuro: alert and oriented   Psych: normal mood and affect    Assessment/Plan     1. Hypertension, unspecified type  - Continue amlodopine, BP at goal      Follow-up: Return for Annual gyn.    Placido Sou. [place], MD  Family Medicine, PGY-1  10/25/19  9:10 AM     [facility] Medicine Preceptor Attestation    I have reviewed the history, exam findings, and plan of care and discussed the care plan with the resident at the time of the visit. I agree with the resident's findings and plan of care as documented above.    Edman Circle, MD, FAAFP  Family Medicine Attending Physician    Signed at 9:14 AM on 10/25/2019.

## 2019-11-12 ENCOUNTER — Ambulatory Visit: Payer: Medicaid Other | Attending: Family Medicine | Admitting: Family Medicine

## 2019-11-12 ENCOUNTER — Encounter: Payer: Self-pay | Admitting: Family Medicine

## 2019-11-12 VITALS — BP 125/69 | HR 115 | Temp 97.9°F | Ht 62.01 in | Wt 202.0 lb

## 2019-11-12 DIAGNOSIS — Z01419 Encounter for gynecological examination (general) (routine) without abnormal findings: Secondary | ICD-10-CM | POA: Insufficient documentation

## 2019-11-12 NOTE — Addendum Note (Signed)
Addended byPatience Musca on: 11/12/2019 03:57 PM     Modules accepted: Orders

## 2019-11-12 NOTE — Progress Notes (Signed)
[place]: Annual Gyn Exam    HPI  Linda Oneal is a 34 y.o.  female who presents for an annual exam.  The patient reports no problems.  Denies vaginal discharge, odor, bleeding, occasionally painful intercourse  Nexplanon Feb 2019    OB/Gyn History  LMP: No LMP recorded. (Menstrual status: Nexplanon).  Menses:periods not-regular- did not have them regularly   Cramps: None  Premenstrual symptoms? No  Menarche: 34yo:   Coitarche: 34 yo    Last pap smear: Date: 07/2013  Results: atypical squamous cellularity of undetermined significance (ASCUS)  History of abnormal pap smear: Yes.    Previous abnormality: atypical squamous cellularity of undetermined significance (ASCUS),2015    The patient is sexually active.   Sexually active: single partner, contraception - nexplanon   Together with partner: 3 years  STD History: chlamydia  Current contraception: Nexplanon  Last Coitus:  2 days ago     OB History   Gravida Para Term Preterm AB Living   '4 4 4 '$ 0 0 4   SAB TAB Ectopic Multiple Live Births   0 0 0 0 4       Patient Active Problem List   Diagnosis Code    Obese E66.9    Family hx of colon cancer Z80.0    Depression F32.9    PCOS (polycystic ovarian syndrome) E28.2    Abnormal Pap smear of cervix R87.619    Nexplanon in place Z97.5    Chronic constipation K59.09    History of abnormal cervical Pap smear Z87.42    Elevated serum creatinine R79.89    Pilonidal cyst L05.91    Hypertension I10      Past Medical History:   Diagnosis Date    BV (bacterial vaginosis)     Chlamydia contact, treated 07/2009    Hx: UTI (urinary tract infection)     Obese     BMI 31    Postpartum depression     Vaginal yeast infection       Past Surgical History:   Procedure Laterality Date    CESAREAN SECTION      PR REMV PILONIDAL LESION COMPLIC N/A 0/01/6760    Procedure: EXCISION, PILONIDAL CYST;  Surgeon: Thornton Park, MD;  Location: [facility];  Service: Colorectal     Current Outpatient Medications    Medication    amLODIPine (NORVASC) 5 MG tablet    etonogestrel (NEXPLANON) 68 MG IMPL    hydrOXYzine HCl (ATARAX) 25 MG tablet    polyethylene glycol (GLYCOLAX) powder     No current facility-administered medications for this visit.     Allergies   Allergen Reactions    Seasonal Allergies Other (See Comments)     Stuffy nose, itchy eyes     Family History   Problem Relation Age of Onset    Hypertension Paternal Grandfather     Breast cancer Paternal Grandmother     Hypertension Paternal Grandmother     Diabetes Paternal Grandmother     Hypertension Father     Diabetes Father     Colon cancer Sister 28        ? BRCA testing    Diabetes Other     Thyroid disease Mother     Diabetes Brother      Social History     Socioeconomic History    Marital status: Single     Spouse name: Not on file    Number of children: Not on file  Years of education: Not on file    Highest education level: Not on file   Occupational History    Not on file   Tobacco Use    Smoking status: Never Smoker    Smokeless tobacco: Never Used   Substance and Sexual Activity    Alcohol use: Yes     Comment: rarely    Drug use: Not Currently     Comment: THC years ago     Sexual activity: Yes     Partners: Male     Birth control/protection: Implant   Social History Narrative    Works at [employer] for [employer]     - lives with 4 children          Safety  Wears seatbelt? yes  Smoke detectors? yes  Carbon monoxide detectors? yes  Uses sunscreen? yes  Adequate housing? yes  Feel safe at home? yes      Health Maintenance  Dental care?  Recommended  Vision screening?  Recommended  Hearing screening?  no  Lipids? Yes  Diabetes screening?  Yes  Colon cancer screening?  N/A  Mammography?  N/A  Dexa scan?  N/A  Health care proxy, advance directives: Discussed  Code status: Full       ROS  CONSTITUTIONAL: Appetite good, no fevers, night sweats or weight loss   CV: No chest pain, palpitations, shortness of breath  or peripheral edema   RESPIRATORY: No cough, wheezing or dyspnea   BREAST: No nipple discharge or breast tenderness  GI: No nausea/vomiting, abdominal pain, or change in bowel habits   GU: No dysuria, urgency or incontinence  GYN: No vaginal discharge, odor, itching, or discomfort  NEURO: No MS changes, no motor weakness, no sensory changes       PHYSICAL EXAM  BP 125/69 (BP Location: Right arm, Patient Position: Sitting, Cuff Size: large adult)    Pulse (!) 115    Temp 36.6 C (97.9 F) (Temporal)    Ht 1.575 m (5' 2.01") Comment: actual   Wt 91.6 kg (202 lb)    BMI 36.94 kg/m   GENERAL APPEARANCE: Appears stated age, well appearing, NAD   HEENT: PERRL, EOMI, TMs normal, oropharynx clear   LUNGS: Clear to auscultation and percussion   HEART: Normal rate, regular rhythm, with normal S1 & S2.  No murmurs, gallops, or rubs.  CHEST: breasts symmetric, with no skin changes.  Normal-appearing nipples.  Non-tender, with no palpable masses.   ABDOMEN: Normoactive BS, soft, non-tender, without organomegaly.   GU: normal external female genitalia.  scant and yellow discharge at vaginal introitus.  Normal parous cervix, bleeding visible-FRIABLE.  No masses in vaginal vault.  No CMT, no adnexal masses.  Normal rectal tone, no masses in rectal vault.  EXTREMITIES: warm, well-perfused, without clubbing, cyanosis, or edema   NEUROLOGIC: Alert and oriented x3, cranial nerves II-XII intact, motor/sensory exam normal, DTRs symmetric, normal gait       ASSESSMENT  Well 34 y.o. year old here for annual exam     PLAN  Repeat gyn exam in 1 years    Immunizations:   Immunization History   Administered Date(s) Administered    Influenza Quadrivalent 0.34m prefilled syringe/single dose vial(FluLaval,Fluzone,Afluria,Fluarix) 05/14/2018       Weight management: Body mass index is 36.94 kg/m..[name]   Discussed the patient's BMI with her. The BMI is above average; BMI management plan is completed.      Sexual health and contraception: The risks  and prevention of STD's were reviewed.    Orders today:   1. Encounter for annual routine gynecological examination  GYN Cytology         Follow-up: PRN     Signed: Nanine Means, NP on 11/12/2019 at 3:48 PM

## 2019-11-14 LAB — CHLAMYDIA PLASMID DNA AMPLIFICATION: Chlamydia plasmid DNA AMplification: 0

## 2019-11-14 LAB — TRICHOMONAS DNA AMPLIFICATION: Trichomonas DNA Amplification: 0

## 2019-11-14 LAB — N. GONORRHOEAE DNA AMPLIFICATION: N. Gonorrhoeae DNA Amplification: 0

## 2019-11-18 ENCOUNTER — Telehealth: Payer: Self-pay

## 2019-11-18 LAB — GYN CYTOLOGY

## 2019-11-18 NOTE — Telephone Encounter (Signed)
-----   Message from Nanine Means, NP sent at 11/18/2019  9:18 AM EDT -----  HI, please call patient and review normal PAP, she will need a re-peat in 5 years   Thanks  Nanine Means, NP  ----- Message -----  From: Edi, [facility]  Sent: 11/14/2019  12:24 PM EDT  To: Nanine Means, NP

## 2019-11-18 NOTE — Telephone Encounter (Signed)
Called [phone] (Mobile) Preferred phone number,  VM left to call back.  See provider message, below.

## 2019-12-16 ENCOUNTER — Other Ambulatory Visit: Payer: Self-pay | Admitting: Student in an Organized Health Care Education/Training Program

## 2019-12-19 ENCOUNTER — Ambulatory Visit: Payer: Medicaid Other | Admitting: Sleep Medicine

## 2019-12-19 NOTE — Telephone Encounter (Signed)
Please [place] to [facility] at 11:00. LVM for pt to call back.

## 2019-12-26 ENCOUNTER — Other Ambulatory Visit: Payer: Self-pay

## 2019-12-26 DIAGNOSIS — I10 Essential (primary) hypertension: Secondary | ICD-10-CM

## 2019-12-27 MED ORDER — AMLODIPINE BESYLATE 5 MG PO TABS *I*
5.0000 mg | ORAL_TABLET | Freq: Every day | ORAL | 5 refills | Status: DC
Start: 2019-12-27 — End: 2019-12-31

## 2019-12-31 ENCOUNTER — Other Ambulatory Visit: Payer: Self-pay

## 2019-12-31 DIAGNOSIS — I10 Essential (primary) hypertension: Secondary | ICD-10-CM

## 2020-01-01 MED ORDER — AMLODIPINE BESYLATE 5 MG PO TABS *I*
5.0000 mg | ORAL_TABLET | Freq: Every day | ORAL | 5 refills | Status: DC
Start: 2020-01-01 — End: 2020-02-05

## 2020-01-03 ENCOUNTER — Ambulatory Visit: Payer: Medicaid Other | Admitting: Obstetrics and Gynecology

## 2020-02-05 ENCOUNTER — Other Ambulatory Visit: Payer: Self-pay

## 2020-02-05 DIAGNOSIS — I10 Essential (primary) hypertension: Secondary | ICD-10-CM

## 2020-02-06 MED ORDER — AMLODIPINE BESYLATE 5 MG PO TABS *I*
5.0000 mg | ORAL_TABLET | Freq: Every day | ORAL | 5 refills | Status: DC
Start: 2020-02-06 — End: 2020-02-20

## 2020-02-11 ENCOUNTER — Encounter: Payer: Self-pay | Admitting: Student in an Organized Health Care Education/Training Program

## 2020-02-11 NOTE — Telephone Encounter (Signed)
error 

## 2020-02-20 ENCOUNTER — Other Ambulatory Visit: Payer: Self-pay

## 2020-02-20 DIAGNOSIS — I10 Essential (primary) hypertension: Secondary | ICD-10-CM

## 2020-02-21 MED ORDER — AMLODIPINE BESYLATE 5 MG PO TABS *I*
5.0000 mg | ORAL_TABLET | Freq: Every day | ORAL | 5 refills | Status: AC
Start: 2020-02-21 — End: 2020-08-19

## 2020-03-31 ENCOUNTER — Encounter: Payer: Self-pay | Admitting: Emergency Medicine

## 2020-03-31 ENCOUNTER — Other Ambulatory Visit: Payer: Self-pay

## 2020-03-31 ENCOUNTER — Ambulatory Visit
Admission: EM | Admit: 2020-03-31 | Discharge: 2020-03-31 | Disposition: A | Discharge status: Home / Self Care | Payer: Self-pay

## 2020-03-31 NOTE — ED Triage Notes (Signed)
Redness, swelling and pain to LT eye since the end of Sept, pt states she had stye.   Pt has seen her eye doctor on 10/02.  Was given z pack and tobramycin and dexamethasone drops.  Pt symptoms are no better and feels like there is a film over her eye making things look blurry. Pt states she feels like she may need a stronger abx.

## 2020-08-31 ENCOUNTER — Encounter (INDEPENDENT_AMBULATORY_CARE_PROVIDER_SITE_OTHER): Payer: Self-pay

## 2020-08-31 ENCOUNTER — Ambulatory Visit (INDEPENDENT_AMBULATORY_CARE_PROVIDER_SITE_OTHER): Payer: Self-pay | Admitting: Internal Medicine

## 2020-10-23 ENCOUNTER — Other Ambulatory Visit: Payer: Self-pay

## 2020-10-23 ENCOUNTER — Ambulatory Visit
Admission: EM | Admit: 2020-10-23 | Discharge: 2020-10-23 | Disposition: A | Discharge status: Home / Self Care | Payer: Self-pay | Attending: Emergency Medicine | Admitting: Emergency Medicine

## 2020-10-23 ENCOUNTER — Encounter: Payer: Self-pay | Admitting: Emergency Medicine

## 2020-10-23 DIAGNOSIS — N3001 Acute cystitis with hematuria: Secondary | ICD-10-CM | POA: Insufficient documentation

## 2020-10-23 LAB — POCT URINALYSIS DIP (MANUAL ENTRY)
Bilirubin, UA: NEGATIVE
Glucose, UA: NEGATIVE mg/dL
Ketones, POC UA: NEGATIVE mg/dL
Nitrite, UA: NEGATIVE
Protein Ur, POC: NEGATIVE mg/dL
Spec Grav, UA: 1.01 (ref 1.010–1.025)
Urobilinogen, UA: 0.2 E.U./dL
pH, UA: 6 (ref 5.0–8.0)

## 2020-10-23 MED ORDER — NITROFURANTOIN MONOHYD MACRO 100 MG PO CAPS: 100.0000 mg | ORAL_CAPSULE | Freq: Two times a day (BID) | ORAL | 0 refills | Status: AC

## 2020-10-23 MED ORDER — FLUCONAZOLE 200 MG PO TABS: ORAL_TABLET | ORAL | 0 refills | Status: DC

## 2020-10-23 MED ORDER — PHENAZOPYRIDINE HCL 200 MG PO TABS: 200.0000 mg | ORAL_TABLET | Freq: Three times a day (TID) | ORAL | 0 refills | Status: AC

## 2020-10-23 NOTE — Discharge Instructions (Signed)
Urine concerning for UTI Urine culture sent.  We will call you with abnormal results.   Push fluids and get plenty of rest.   Take antibiotic as directed and to completion Take pyridium as prescribed and as needed for symptomatic relief Follow up with PCP if symptoms persists Return here or go to ER if you have any new or worsening symptoms such as fever, worsening abdominal pain, nausea/vomiting, flank pain, etc...  Diflucan for yeast infection

## 2020-10-23 NOTE — ED Triage Notes (Signed)
Urinary frequency and pain since Monday.  Taking azo.  Needs diflucan if she gets abx.

## 2020-10-23 NOTE — ED Provider Notes (Signed)
[facility]   CC: Burning with urination  SUBJECTIVE:  Patricia Fowler is a 35 y.o. female who complains of urinary frequency and dysuria x 4 days.  Admits to recent sexual activity.   Denies abdominal or flank pain.  Has tried OTC medications without relief.  Symptoms are made worse with urination.  Admits to similar symptoms in the past.  Denies fever, chills, nausea, vomiting, abdominal pain, flank pain, abnormal vaginal discharge or bleeding, hematuria.    LMP: Patient's last menstrual period was 10/11/2020.  ROS: As in HPI.  All other pertinent ROS negative.     History reviewed. No pertinent past medical history. Past Surgical History:  Procedure Laterality Date  . APPENDECTOMY    . KNEE SURGERY Right   . MASTECTOMY, PARTIAL Left    No Known Allergies No current facility-administered medications on file prior to encounter.   No current outpatient medications on file prior to encounter.   Social History   Socioeconomic History  . Marital status: Single    Spouse name: Not on file  . Number of children: Not on file  . Years of education: Not on file  . Highest education level: Not on file  Occupational History  . Not on file  Tobacco Use  . Smoking status: Current Some Day Smoker  . Smokeless tobacco: Never Used  Substance and Sexual Activity  . Alcohol use: Yes    Comment: occ  . Drug use: Never  . Sexual activity: Not on file  Other Topics Concern  . Not on file  Social History Narrative  . Not on file   Social Determinants of Health   Financial Resource Strain: Not on file  Food Insecurity: Not on file  Transportation Needs: Not on file  Physical Activity: Not on file  Stress: Not on file  Social Connections: Not on file  Intimate Partner Violence: Not on file   No family history on file.  OBJECTIVE:  Vitals:   10/23/20 1648  BP: (!) 152/82  Pulse: 90  Resp: 17  Temp: 98.4 F (36.9 C)  TempSrc: Oral  SpO2: 98%   General  appearance: Alert in no acute distress HEENT: NCAT.  Oropharynx clear.  Lungs: clear to auscultation bilaterally without adventitious breath sounds Heart: regular rate and rhythm.  Abdomen: soft; non-distended; no tenderness; bowel sounds present; no guarding Back: no CVA tenderness Extremities: no edema; symmetrical with no gross deformities Skin: warm and dry Neurologic: Ambulates from chair to exam table without difficulty Psychological: alert and cooperative; normal mood and affect  Labs Reviewed  POCT URINALYSIS DIP (MANUAL ENTRY) - Abnormal; Notable for the following components:      Result Value   Clarity, UA cloudy (*)    Blood, UA moderate (*)    Leukocytes, UA Large (3+) (*)    All other components within normal limits  URINE CULTURE    ASSESSMENT & PLAN:  1. Acute cystitis with hematuria     Meds ordered this encounter  Medications  . nitrofurantoin, macrocrystal-monohydrate, (MACROBID) 100 MG capsule    Sig: Take 1 capsule (100 mg total) by mouth 2 (two) times daily.    Dispense:  10 capsule    Refill:  0    Order Specific Question:   Supervising Provider    Answer:   Eustace Moore [[id number]]  . phenazopyridine (PYRIDIUM) 200 MG tablet    Sig: Take 1 tablet (200 mg total) by mouth 3 (three) times daily.    Dispense:  6 tablet    Refill:  0    Order Specific Question:   Supervising Provider    Answer:   Eustace Moore [[id number]]  . fluconazole (DIFLUCAN) 200 MG tablet    Sig: Take one dose by mouth, wait 72 hours, and then take second dose by mouth    Dispense:  2 tablet    Refill:  0    Order Specific Question:   Supervising Provider    Answer:   Eustace Moore [[id number]]   Urine concerning for UTI Urine culture sent.  We will call you with abnormal results.   Push fluids and get plenty of rest.   Take antibiotic as directed and to completion Take pyridium as prescribed and as needed for symptomatic relief Follow up with PCP if symptoms  persists Return here or go to ER if you have any new or worsening symptoms such as fever, worsening abdominal pain, nausea/vomiting, flank pain, etc...  Diflucan for yeast infection  Outlined signs and symptoms indicating need for more acute intervention. Patient verbalized understanding. After Visit Summary given.     Rennis Harding, PA-C 10/23/20 1714

## 2020-10-26 LAB — URINE CULTURE
Culture: 20000 — AB
Special Requests: NORMAL

## 2020-12-10 ENCOUNTER — Other Ambulatory Visit: Payer: Self-pay

## 2020-12-10 ENCOUNTER — Emergency Department (HOSPITAL_BASED_OUTPATIENT_CLINIC_OR_DEPARTMENT_OTHER)
Admission: EM | Admit: 2020-12-10 | Discharge: 2020-12-11 | Disposition: A | Discharge status: Home / Self Care | Payer: Self-pay | Attending: Emergency Medicine | Admitting: Emergency Medicine

## 2020-12-10 DIAGNOSIS — M5442 Lumbago with sciatica, left side: Secondary | ICD-10-CM | POA: Insufficient documentation

## 2020-12-10 HISTORY — DX: Polycystic ovarian syndrome: E28.2

## 2020-12-10 NOTE — ED Triage Notes (Signed)
Pt reports helping friend move item, when she sat down she felt a pain in her left lower back that is shooting down buttock and left leg. Reports pain comes in waves.

## 2020-12-11 ENCOUNTER — Encounter (HOSPITAL_BASED_OUTPATIENT_CLINIC_OR_DEPARTMENT_OTHER): Payer: Self-pay

## 2020-12-11 MED ORDER — CYCLOBENZAPRINE HCL 10 MG PO TABS
10.0000 mg | ORAL_TABLET | Freq: Once | ORAL | Status: AC
  Administered 2020-12-11: 10 mg via ORAL
  Filled 2020-12-11: qty 1

## 2020-12-11 MED ORDER — NAPROXEN 375 MG PO TABS: ORAL_TABLET | ORAL | 0 refills | Status: AC

## 2020-12-11 MED ORDER — NAPROXEN 250 MG PO TABS
500.0000 mg | ORAL_TABLET | Freq: Once | ORAL | Status: AC
  Administered 2020-12-11: 500 mg via ORAL
  Filled 2020-12-11: qty 2

## 2020-12-11 MED ORDER — CYCLOBENZAPRINE HCL 10 MG PO TABS: 10.0000 mg | ORAL_TABLET | Freq: Three times a day (TID) | ORAL | 0 refills | Status: AC | PRN

## 2020-12-11 NOTE — ED Provider Notes (Signed)
[facility] Provider Note: Lowella Dell, MD, FACEP  CSN: [id number] MRN: [id number] ARRIVAL: 12/10/20 at 2345 ROOM: [facility]   CHIEF COMPLAINT  Back Pain   HISTORY OF PRESENT ILLNESS  12/11/20 12:51 AM Christina Figueroa is a 35 y.o. female who was helping a friend move an item yesterday.  She then went and sat down and when she sat down she felt a sudden pain in her mid lower back.  Throughout the day the pain migrated to the left lumbar region radiating around the left buttock and thigh and about the L5 dermatome.  She is describes the pain as a stabbing, shooting and spasm-like pain.  She rates it as a 7 out of 10.  It is worse with movement of the lower back and with certain positions.  She has taken Tylenol and 1 Aleve tablet without adequate relief.  She has also been applying ice with some relief.  She denies any numbness or weakness.  She has had no bowel or bladder changes   Past Medical History:  Diagnosis Date   PCOS (polycystic ovarian syndrome)     Past Surgical History:  Procedure Laterality Date   CESAREAN SECTION     CYST EXCISION      History reviewed. No pertinent family history.  Social History   Tobacco Use   Smoking status: Never   Smokeless tobacco: Never  Vaping Use   Vaping Use: Never used  Substance Use Topics   Alcohol use: Yes    Comment: occasional    Drug use: No    Prior to Admission medications   Medication Sig Start Date End Date Taking? Authorizing Provider  cyclobenzaprine (FLEXERIL) 10 MG tablet Take 1 tablet (10 mg total) by mouth 3 (three) times daily as needed for muscle spasms. 12/11/20  Yes Tashanda Fuhrer, MD  naproxen (NAPROSYN) 375 MG tablet Take 1 tablet twice daily as needed for back pain. 12/11/20  Yes Justiss Gerbino, MD    Allergies Patient has no known allergies.   REVIEW OF SYSTEMS  Negative except as noted here or in the History of Present Illness.   PHYSICAL EXAMINATION  Initial Vital Signs Blood  pressure (!) 163/102, pulse 97, temperature 99.4 F (37.4 C), temperature source Oral, resp. rate 20, height 5\' 1"  (1.549 m), weight 93 kg, SpO2 100 %.  Examination General: Well-developed, well-nourished female in no acute distress; appearance consistent with age of record HENT: normocephalic; atraumatic Eyes: Normal appearance Neck: supple Heart: regular rate and rhythm Lungs: clear to auscultation bilaterally Abdomen: soft; nondistended; nontender; bowel sounds present Back: Left paralumbar tenderness; pain on movement of back Extremities: No deformity; full range of motion; pulses normal Neurologic: Awake, alert and oriented; motor function intact in all extremities and symmetric; sensation in lower extremities intact and symmetric; no facial droop Skin: Warm and dry Psychiatric: Normal mood and affect   RESULTS  Summary of this visit's results, reviewed and interpreted by myself:   EKG Interpretation  Date/Time:    Ventricular Rate:    PR Interval:    QRS Duration:   QT Interval:    QTC Calculation:   R Axis:     Text Interpretation:          Laboratory Studies: No results found for this or any previous visit (from the past 24 hour(s)). Imaging Studies: No results found.  ED COURSE and MDM  Nursing notes, initial and subsequent vitals signs, including pulse oximetry, reviewed and interpreted by myself.  Vitals:  12/11/20 0000 12/11/20 0003  BP:  (!) 163/102  Pulse:  97  Resp:  20  Temp:  99.4 F (37.4 C)  TempSrc:  Oral  SpO2:  100%  Weight: 93 kg   Height: 5\' 1"  (1.549 m)    Medications  naproxen (NAPROSYN) tablet 500 mg (has no administration in time range)  cyclobenzaprine (FLEXERIL) tablet 10 mg (has no administration in time range)    We will treat with naproxen and Flexeril.  The patient does not wish a narcotic.  The pattern of pain suggests an L5 radiculopathy.  PROCEDURES  Procedures   ED DIAGNOSES     ICD-10-CM   1. Acute  left-sided low back pain with left-sided sciatica  M54.42          Nahshon Reich, , MD 12/11/20 0110

## 2020-12-14 ENCOUNTER — Other Ambulatory Visit: Payer: Self-pay

## 2020-12-14 ENCOUNTER — Encounter (HOSPITAL_BASED_OUTPATIENT_CLINIC_OR_DEPARTMENT_OTHER): Payer: Self-pay

## 2020-12-14 ENCOUNTER — Emergency Department (HOSPITAL_BASED_OUTPATIENT_CLINIC_OR_DEPARTMENT_OTHER)
Admission: EM | Admit: 2020-12-14 | Discharge: 2020-12-14 | Disposition: A | Discharge status: Home / Self Care | Payer: Medicaid Other | Attending: Emergency Medicine | Admitting: Emergency Medicine

## 2020-12-14 DIAGNOSIS — M545 Low back pain, unspecified: Secondary | ICD-10-CM | POA: Insufficient documentation

## 2020-12-14 DIAGNOSIS — X509XXA Other and unspecified overexertion or strenuous movements or postures, initial encounter: Secondary | ICD-10-CM | POA: Insufficient documentation

## 2020-12-14 DIAGNOSIS — I10 Essential (primary) hypertension: Secondary | ICD-10-CM | POA: Insufficient documentation

## 2020-12-14 HISTORY — DX: Essential (primary) hypertension: I10

## 2020-12-14 MED ORDER — LIDOCAINE 5 % EX PTCH
1.0000 | MEDICATED_PATCH | CUTANEOUS | Status: DC
  Administered 2020-12-14: 1 via TRANSDERMAL
  Filled 2020-12-14: qty 1

## 2020-12-14 NOTE — Discharge Instructions (Addendum)
You were seen in the emergency department today for your low back pain.  Your physical exam and vital signs are very reassuring.  The muscles in your left low back and buttock are in what is called spasm, meaning they are inappropriately tightened up.  This can be quite painful.  To help with your pain you may take Tylenol and / or NSAID medication (such as ibuprofen or naproxen) to help with your pain.  Additionally, you have been previously prescribed a muscle relaxer called flexeril to help relieve some of the muscle spasm.  Please be advised that this medication may make you very sleepy, so you should not drive or operate heavy machinery while you are taking it.  You may also utilize topical pain relief such as Biofreeze, IcyHot, or topical lidocaine patches.  I also recommend that you apply heat to the area, such as a hot shower or heating pad, and follow heat application with massage of the muscles that are most tight.  Please return to the emergency department if you develop any numbness/tingling/weakness in your arms or legs, any difficulty urinating, or urinary incontinence chest pain, shortness of breath, abdominal pain, nausea or vomiting that does not stop, or any other new severe symptoms.

## 2020-12-14 NOTE — ED Triage Notes (Signed)
Pt c/o lower back pain x 4 days-states she was seen here recently for same-states she is unable to return to [facility] gait

## 2020-12-14 NOTE — ED Notes (Signed)
Pt provided discharge instructions and prescription information. Pt was given the opportunity to ask questions and questions were answered. Discharge signature not obtained in the setting of the COVID-19 pandemic in order to reduce high touch surfaces.  ° °

## 2020-12-14 NOTE — ED Provider Notes (Signed)
[facility] EMERGENCY DEPARTMENT Provider Note   CSN: [id number] Arrival date & time: 12/14/20  1255     History Chief Complaint  Patient presents with   Back Pain    Christina Figueroa is a 35 y.o. female who was seen in the emergency department 4 days ago for low back pain after moving a large heavy object.  At that time she was diagnosed with lumbar radiculopathy and treated with NSAIDs and Flexeril.  She states that at that time she was offered a work note, however declined it.  She returns today endorsing improved low back pain without any numbness/tingling/weakness in her lower extremities and without any saddle anesthesia or urinary symptoms.  No bowel retention or incontinence either.  She states that she returns today primarily for work note as she does not yet feel ready to return to work as she is regularly involved in heavy lifting in her job.  She states at present she presented in spot requesting new work note, however was informed that she would need to stay for further evaluation in order to receive a work note.  She states that heat and anti-inflammatory medications have been helpful.  HPI     Past Medical History:  Diagnosis Date   Hypertension    PCOS (polycystic ovarian syndrome)     There are no problems to display for this patient.   Past Surgical History:  Procedure Laterality Date   CESAREAN SECTION     CYST EXCISION       OB History     Gravida  5   Para  4   Term  4   Preterm      AB  1   Living  4      SAB      IAB      Ectopic  1   Multiple      Live Births              No family history on file.  Social History   Tobacco Use   Smoking status: Never   Smokeless tobacco: Never  Vaping Use   Vaping Use: Never used  Substance Use Topics   Alcohol use: Yes    Comment: occasional    Drug use: No    Home Medications Prior to Admission medications   Medication Sig Start Date End Date Taking? Authorizing  Provider  cyclobenzaprine (FLEXERIL) 10 MG tablet Take 1 tablet (10 mg total) by mouth 3 (three) times daily as needed for muscle spasms. 12/11/20   Molpus, John, MD  naproxen (NAPROSYN) 375 MG tablet Take 1 tablet twice daily as needed for back pain. 12/11/20   Molpus, Jonny Ruiz, MD    Allergies    Patient has no known allergies.  Review of Systems   Review of Systems  Constitutional: Negative.   HENT: Negative.    Respiratory: Negative.    Cardiovascular: Negative.   Gastrointestinal: Negative.   Genitourinary: Negative.   Musculoskeletal:  Positive for back pain.  Skin: Negative.   Neurological: Negative.   Hematological: Negative.    Physical Exam Updated Vital Signs BP (!) 159/104 (BP Location: Right Arm)   Pulse 97   Temp 98.8 F (37.1 C) (Oral)   Resp 20   Ht 5\' 1"  (1.549 m)   Wt 91.2 kg   LMP  (LMP Unknown) Comment: PCOS, has not had a period in years  SpO2 100%   BMI 37.98 kg/m   Physical Exam Vitals  and nursing note reviewed.  HENT:     Head: Normocephalic and atraumatic.     Mouth/Throat:     Mouth: Mucous membranes are moist.     Pharynx: No oropharyngeal exudate or posterior oropharyngeal erythema.  Eyes:     General:        Right eye: No discharge.        Left eye: No discharge.     Conjunctiva/sclera: Conjunctivae normal.  Cardiovascular:     Rate and Rhythm: Normal rate and regular rhythm.     Pulses: Normal pulses.     Heart sounds: Normal heart sounds. No murmur heard. Pulmonary:     Effort: Pulmonary effort is normal. No respiratory distress.     Breath sounds: Normal breath sounds. No wheezing or rales.  Abdominal:     General: Bowel sounds are normal. There is no distension.     Palpations: Abdomen is soft.     Tenderness: There is no abdominal tenderness.  Musculoskeletal:        General: No deformity.     Cervical back: Normal and neck supple.     Thoracic back: Spasms present. No tenderness or bony tenderness.     Lumbar back: Spasms and  tenderness present. No bony tenderness. Negative right straight leg raise test and negative left straight leg raise test.     Right lower leg: No edema.     Left lower leg: No edema.     Comments: Spasm and tenderness palpation of the bilateral lumbar paraspinous musculature without midline tenderness to palpation Patient with symmetric strength in plantar/dorsiflexion as well as knee flexion and extension and sensation bilateral lower extremities  Skin:    General: Skin is warm and dry.     Capillary Refill: Capillary refill takes less than 2 seconds.  Neurological:     General: No focal deficit present.     Mental Status: She is alert and oriented to person, place, and time. Mental status is at baseline.     Sensory: Sensation is intact.     Motor: Motor function is intact.     Gait: Gait is intact.  Psychiatric:        Mood and Affect: Mood normal.    ED Results / Procedures / Treatments   Labs (all labs ordered are listed, but only abnormal results are displayed) Labs Reviewed - No data to display  EKG None  Radiology No results found.  Procedures Procedures   Medications Ordered in ED Medications - No data to display  ED Course  I have reviewed the triage vital signs and the nursing notes.  Pertinent labs & imaging results that were available during my care of the patient were reviewed by me and considered in my medical decision making (see chart for details).    MDM Rules/Calculators/A&P                         35 year old female presents with concern for persistent low back pain that is improved from initial ED visit 4 days ago.  Requesting updated work note.  Hypertensive on intake, vital signs otherwise normal.  Cardiopulmonary exam is normal, abdominal exam is benign.  Musculoskeletal exam revealed lumbar paraspinous musculature tenderness palpation without midline tenderness, no focal deficit on neurologic exam.  Patient's presentation consistent with  previously diagnosed lumbar strain/radiculopathy.  Upon chart review, patient does appear to be improved from initial ED visit for this concern.  Given reassuring physical exam  and vital signs, no further work-up is warranted in the ED at this time.  She may continue to do OTC analgesia as needed as well as heat application and topical analgesia.  Nataleigh voiced understanding of her medical evaluation and treatment plan.  Each of her questions was answered to her expressed satisfaction.  Return precautions given.  Patient is well-appearing, stable, and appropriate for discharge at this time.  This chart was dictated using voice recognition software, Dragon. Despite the best efforts of this provider to proofread and correct errors, errors may still occur which can change documentation meaning.    Final Clinical Impression(s) / ED Diagnoses Final diagnoses:  Acute left-sided low back pain without sciatica    Rx / DC Orders ED Discharge Orders     None        Sherrilee Gilles 12/17/20 1651    Terrilee Files, MD 12/18/20 1218

## 2021-02-03 ENCOUNTER — Other Ambulatory Visit: Payer: Self-pay

## 2021-02-03 ENCOUNTER — Encounter: Payer: Self-pay | Admitting: Emergency Medicine

## 2021-02-03 ENCOUNTER — Ambulatory Visit
Admission: EM | Admit: 2021-02-03 | Discharge: 2021-02-03 | Disposition: A | Discharge status: Home / Self Care | Payer: Self-pay | Attending: Family Medicine | Admitting: Family Medicine

## 2021-02-03 DIAGNOSIS — B009 Herpesviral infection, unspecified: Secondary | ICD-10-CM | POA: Insufficient documentation

## 2021-02-03 DIAGNOSIS — J029 Acute pharyngitis, unspecified: Secondary | ICD-10-CM | POA: Insufficient documentation

## 2021-02-03 LAB — POCT RAPID STREP A (OFFICE): Rapid Strep A Screen: NEGATIVE

## 2021-02-03 MED ORDER — AMOXICILLIN 875 MG PO TABS: 875.0000 mg | ORAL_TABLET | Freq: Two times a day (BID) | ORAL | 0 refills | Status: AC

## 2021-02-03 MED ORDER — VALACYCLOVIR HCL 1 G PO TABS: 1000.0000 mg | ORAL_TABLET | Freq: Three times a day (TID) | ORAL | 1 refills | Status: AC

## 2021-02-03 MED ORDER — FLUCONAZOLE 150 MG PO TABS: ORAL_TABLET | ORAL | 0 refills | Status: DC

## 2021-02-03 NOTE — ED Triage Notes (Signed)
Sore throat and white patches on throat that started yesterday.  Also would like a rx for herpes outbreak on her face.

## 2021-02-03 NOTE — ED Provider Notes (Signed)
  [facility]   [id number] 02/03/21 Arrival Time: 6144  ASSESSMENT & PLAN:  1. Sore throat   2. Herpes simplex infection of skin    No signs of peritonsillar abscess. Discussed. Throat exam very suspicious for strep throat. Will treat empirically for strep in addition to facial herpes outbreak.  Begin: Meds ordered this encounter  Medications   valACYclovir (VALTREX) 1000 MG tablet    Sig: Take 1 tablet (1,000 mg total) by mouth 3 (three) times daily.    Dispense:  21 tablet    Refill:  1   amoxicillin (AMOXIL) 875 MG tablet    Sig: Take 1 tablet (875 mg total) by mouth 2 (two) times daily for 10 days.    Dispense:  20 tablet    Refill:  0   fluconazole (DIFLUCAN) 150 MG tablet    Sig: Take one tablet by mouth as a single dose. May repeat in 3 days if symptoms persist.    Dispense:  2 tablet    Refill:  0    Rapid strep negative. Culture sent. OTC analgesics and throat care as needed  Reviewed expectations re: course of current medical issues. Questions answered. Outlined signs and symptoms indicating need for more acute intervention. Patient verbalized understanding. After Visit Summary given.   SUBJECTIVE:  Patricia Fowler is a 35 y.o. female who reports a sore throat. Describes as painful swallowing. Onset abrupt beginning yesterday. Symptoms have gradually worsened since beginning; without voice changes. No respiratory symptoms. Normal PO intake but reports discomfort with swallowing. No specific alleviating factors. Fever: ques subj. No neck pain or swelling. No associated nausea, vomiting, or abdominal pain. Known sick contacts: none. Recent travel: none. No tx PTA.  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  OBJECTIVE:  Vitals:   02/03/21 0843  BP: 136/86  Pulse: 64  Resp: 18  Temp: 98.2 F (36.8 C)  TempSrc: Oral  SpO2: 97%     General appearance: alert; no distress HEENT: throat with moderate  erythema and exudative tonsillary hypertrophy; uvula is midline Neck: supple with FROM; small bilateral cervical LAD Lungs: speaks full sentences without difficulty; unlabored Abd: soft; non-tender Skin: herpes outbreak on R cheek; no sign of bacterial infection; warm and dry Psychological: alert and cooperative; normal mood and affect  No Known Allergies  History reviewed. No pertinent past medical history.  Social History   Socioeconomic History   Marital status: Single    Spouse name: Not on file   Number of children: Not on file   Years of education: Not on file   Highest education level: Not on file  Occupational History   Not on file  Tobacco Use   Smoking status: Some Days   Smokeless tobacco: Never  Substance and Sexual Activity   Alcohol use: Yes    Comment: occ   Drug use: Never   Sexual activity: Not on file  Other Topics Concern   Not on file  Social History Narrative   Not on file   Social Determinants of Health   Financial Resource Strain: Not on file  Food Insecurity: Not on file  Transportation Needs: Not on file  Physical Activity: Not on file  Stress: Not on file  Social Connections: Not on file  Intimate Partner Violence: Not on file   No family history on file.         Mardella Layman, MD 02/03/21 [phone]

## 2021-02-06 LAB — CULTURE, GROUP A STREP (THRC)

## 2021-06-26 ENCOUNTER — Other Ambulatory Visit: Payer: Self-pay

## 2021-06-26 ENCOUNTER — Encounter (HOSPITAL_BASED_OUTPATIENT_CLINIC_OR_DEPARTMENT_OTHER): Payer: Self-pay | Admitting: Emergency Medicine

## 2021-06-26 ENCOUNTER — Emergency Department (HOSPITAL_BASED_OUTPATIENT_CLINIC_OR_DEPARTMENT_OTHER)
Admission: EM | Admit: 2021-06-26 | Discharge: 2021-06-26 | Disposition: A | Discharge status: Home / Self Care | Payer: Medicaid Other | Attending: Emergency Medicine | Admitting: Emergency Medicine

## 2021-06-26 ENCOUNTER — Emergency Department (HOSPITAL_BASED_OUTPATIENT_CLINIC_OR_DEPARTMENT_OTHER): Payer: Medicaid Other

## 2021-06-26 DIAGNOSIS — Z20822 Contact with and (suspected) exposure to covid-19: Secondary | ICD-10-CM | POA: Insufficient documentation

## 2021-06-26 DIAGNOSIS — R000 Tachycardia, unspecified: Secondary | ICD-10-CM | POA: Insufficient documentation

## 2021-06-26 DIAGNOSIS — J101 Influenza due to other identified influenza virus with other respiratory manifestations: Secondary | ICD-10-CM | POA: Insufficient documentation

## 2021-06-26 DIAGNOSIS — I10 Essential (primary) hypertension: Secondary | ICD-10-CM | POA: Insufficient documentation

## 2021-06-26 LAB — CBC WITH DIFFERENTIAL/PLATELET
Abs Immature Granulocytes: 0.04 10*3/uL (ref 0.00–0.07)
Basophils Absolute: 0 10*3/uL (ref 0.0–0.1)
Basophils Relative: 0 %
Eosinophils Absolute: 0 10*3/uL (ref 0.0–0.5)
Eosinophils Relative: 0 %
HCT: 39 % (ref 36.0–46.0)
Hemoglobin: 12.8 g/dL (ref 12.0–15.0)
Immature Granulocytes: 1 %
Lymphocytes Relative: 29 %
Lymphs Abs: 2.2 10*3/uL (ref 0.7–4.0)
MCH: 27.4 pg (ref 26.0–34.0)
MCHC: 32.8 g/dL (ref 30.0–36.0)
MCV: 83.5 fL (ref 80.0–100.0)
Monocytes Absolute: 0.7 10*3/uL (ref 0.1–1.0)
Monocytes Relative: 10 %
Neutro Abs: 4.6 10*3/uL (ref 1.7–7.7)
Neutrophils Relative %: 60 %
Platelets: 188 10*3/uL (ref 150–400)
RBC: 4.67 MIL/uL (ref 3.87–5.11)
RDW: 12.7 % (ref 11.5–15.5)
WBC: 7.6 10*3/uL (ref 4.0–10.5)
nRBC: 0 % (ref 0.0–0.2)

## 2021-06-26 LAB — URINALYSIS, ROUTINE W REFLEX MICROSCOPIC
Bilirubin Urine: NEGATIVE
Glucose, UA: NEGATIVE mg/dL
Hgb urine dipstick: NEGATIVE
Ketones, ur: NEGATIVE mg/dL
Nitrite: NEGATIVE
Protein, ur: NEGATIVE mg/dL
Specific Gravity, Urine: 1.015 (ref 1.005–1.030)
pH: 6.5 (ref 5.0–8.0)

## 2021-06-26 LAB — URINALYSIS, MICROSCOPIC (REFLEX)

## 2021-06-26 LAB — COMPREHENSIVE METABOLIC PANEL
ALT: 15 U/L (ref 0–44)
AST: 23 U/L (ref 15–41)
Albumin: 3.4 g/dL — ABNORMAL LOW (ref 3.5–5.0)
Alkaline Phosphatase: 73 U/L (ref 38–126)
Anion gap: 10 (ref 5–15)
BUN: 11 mg/dL (ref 6–20)
CO2: 20 mmol/L — ABNORMAL LOW (ref 22–32)
Calcium: 8.3 mg/dL — ABNORMAL LOW (ref 8.9–10.3)
Chloride: 103 mmol/L (ref 98–111)
Creatinine, Ser: 0.96 mg/dL (ref 0.44–1.00)
GFR, Estimated: >60 mL/min (ref >60)
Glucose, Bld: 94 mg/dL (ref 70–99)
Potassium: 3.3 mmol/L — ABNORMAL LOW (ref 3.5–5.1)
Sodium: 133 mmol/L — ABNORMAL LOW (ref 135–145)
Total Bilirubin: 0.7 mg/dL (ref 0.3–1.2)
Total Protein: 7.6 g/dL (ref 6.5–8.1)

## 2021-06-26 LAB — RESP PANEL BY RT-PCR (FLU A&B, COVID) ARPGX2
Influenza A by PCR: POSITIVE — AB
Influenza B by PCR: NEGATIVE
SARS Coronavirus 2 by RT PCR: NEGATIVE

## 2021-06-26 LAB — PREGNANCY, URINE: Preg Test, Ur: NEGATIVE

## 2021-06-26 LAB — LACTIC ACID, PLASMA: Lactic Acid, Venous: 1.1 mmol/L (ref 0.5–1.9)

## 2021-06-26 MED ORDER — LACTATED RINGERS IV BOLUS
1000.0000 mL | Freq: Once | INTRAVENOUS | Status: AC
  Administered 2021-06-26: 1000 mL via INTRAVENOUS

## 2021-06-26 MED ORDER — ONDANSETRON 4 MG PO TBDP: 4.0000 mg | ORAL_TABLET | Freq: Three times a day (TID) | ORAL | 0 refills | Status: DC | PRN

## 2021-06-26 MED ORDER — ACETAMINOPHEN 325 MG PO TABS
650.0000 mg | ORAL_TABLET | Freq: Once | ORAL | Status: AC | PRN
  Administered 2021-06-26: 650 mg via ORAL
  Filled 2021-06-26: qty 2

## 2021-06-26 MED ORDER — IBUPROFEN 400 MG PO TABS: 600.0000 mg | ORAL_TABLET | Freq: Once | ORAL | Status: DC

## 2021-06-26 MED ORDER — PROMETHAZINE-DM 6.25-15 MG/5ML PO SYRP: 5.0000 mL | ORAL_SOLUTION | Freq: Four times a day (QID) | ORAL | 0 refills | Status: AC | PRN

## 2021-06-26 MED ORDER — BENZONATATE 100 MG PO CAPS: 100.0000 mg | ORAL_CAPSULE | Freq: Three times a day (TID) | ORAL | 0 refills | Status: AC

## 2021-06-26 MED ORDER — BENZONATATE 100 MG PO CAPS: 100.0000 mg | ORAL_CAPSULE | Freq: Three times a day (TID) | ORAL | 0 refills | Status: DC

## 2021-06-26 MED ORDER — PROMETHAZINE-DM 6.25-15 MG/5ML PO SYRP: 5.0000 mL | ORAL_SOLUTION | Freq: Four times a day (QID) | ORAL | 0 refills | Status: DC | PRN

## 2021-06-26 MED ORDER — ONDANSETRON 4 MG PO TBDP: 4.0000 mg | ORAL_TABLET | Freq: Three times a day (TID) | ORAL | 0 refills | Status: AC | PRN

## 2021-06-26 NOTE — Discharge Instructions (Addendum)
You tested positive for influenza A versus a viral illness that will resolve with time.  Drink plenty of water take Tylenol and ibuprofen as discussed below I have prescribed you to cough medications which she may use as prescribed and Zofran should you experience any nausea.  Please use Tylenol or ibuprofen for pain.  You may use 600 mg ibuprofen every 6 hours or 1000 mg of Tylenol every 6 hours.  You may choose to alternate between the 2.  This would be most effective.  Not to exceed 4 g of Tylenol within 24 hours.  Not to exceed 3200 mg ibuprofen 24 hours.   Viral Illness TREATMENT  Treatment is directed at relieving symptoms. There is no cure. Antibiotics are not effective, because the infection is caused by a virus, not by bacteria. Treatment may include:  Increased fluid intake. Sports drinks offer valuable electrolytes, sugars, and fluids.  Breathing heated mist or steam (vaporizer or shower).  Eating chicken soup or other clear broths, and maintaining good nutrition.  Getting plenty of rest.  Using gargles or lozenges for comfort.  Increasing usage of your inhaler if you have asthma.  Return to work when your temperature has returned to normal.  Gargle warm salt water and spit it out for sore throat. Take benadryl to decrease sinus secretions. Continue to alternate between Tylenol and ibuprofen for pain and fever control.  Follow Up: Follow up with your primary care doctor in 5-7 days for recheck of ongoing symptoms.  Return to emergency department for emergent changing or worsening of symptoms.

## 2021-06-26 NOTE — ED Provider Notes (Signed)
[facility] EMERGENCY DEPARTMENT Provider Note   CSN: [id number] Arrival date & time: 06/26/21  1801     History Chief Complaint  Patient presents with   Cough    Christina Figueroa is a 35 y.o. female.   Cough Associated symptoms: chills, fever and myalgias   Associated symptoms: no chest pain, no headaches, no rash and no shortness of breath   Patient is a 35 year old female presented to the emergency room today history of PCOS hypertension  She is presented to emergency room with complaints of cough and congestion fatigue myalgias has been ongoing for approximately 4 to 5 days.  She states her symptoms been constant she has not taken any Tylenol or Motrin today.  She denies any chest pain although she does have some chest tightness especially with coughing.  No hemoptysis no lightheadedness or dizziness.  No nausea vomiting or diarrhea.      Past Medical History:  Diagnosis Date   Hypertension    PCOS (polycystic ovarian syndrome)     There are no problems to display for this patient.   Past Surgical History:  Procedure Laterality Date   CESAREAN SECTION     CYST EXCISION       OB History     Gravida  5   Para  4   Term  4   Preterm      AB  1   Living  4      SAB      IAB      Ectopic  1   Multiple      Live Births              No family history on file.  Social History   Tobacco Use   Smoking status: Never   Smokeless tobacco: Never  Vaping Use   Vaping Use: Never used  Substance Use Topics   Alcohol use: Yes    Comment: occasional    Drug use: No    Home Medications Prior to Admission medications   Medication Sig Start Date End Date Taking? Authorizing Provider  benzonatate (TESSALON) 100 MG capsule Take 1 capsule (100 mg total) by mouth every 8 (eight) hours. 06/26/21  Yes Sayed Apostol S, PA  ondansetron (ZOFRAN-ODT) 4 MG disintegrating tablet Take 1 tablet (4 mg total) by mouth every 8 (eight) hours as  needed for nausea or vomiting. 06/26/21  Yes Zariya Minner S, PA  promethazine-dextromethorphan (PROMETHAZINE-DM) 6.25-15 MG/5ML syrup Take 5 mLs by mouth 4 (four) times daily as needed for cough. 06/26/21  Yes Jeral Zick S, PA  cyclobenzaprine (FLEXERIL) 10 MG tablet Take 1 tablet (10 mg total) by mouth 3 (three) times daily as needed for muscle spasms. 12/11/20   Molpus, John, MD  naproxen (NAPROSYN) 375 MG tablet Take 1 tablet twice daily as needed for back pain. 12/11/20   Molpus, Jonny Ruiz, MD    Allergies    Patient has no known allergies.  Review of Systems   Review of Systems  Constitutional:  Positive for chills, fatigue and fever.  HENT:  Positive for congestion.   Eyes:  Negative for pain.  Respiratory:  Positive for cough. Negative for shortness of breath.   Cardiovascular:  Negative for chest pain and leg swelling.  Gastrointestinal:  Negative for abdominal pain, diarrhea, nausea and vomiting.  Genitourinary:  Negative for dysuria.  Musculoskeletal:  Positive for myalgias.  Skin:  Negative for rash.  Neurological:  Negative for dizziness and headaches.  Physical Exam Updated Vital Signs BP 128/81    Pulse 81    Temp 98.6 F (37 C) (Oral)    Resp 12    Ht 5\' 2"  (1.575 m)    Wt 90.7 kg    LMP  (LMP Unknown) Comment: Has not had menstrual period since eptopic pregnancy years ago   SpO2 98%    BMI 36.58 kg/m   Physical Exam Vitals and nursing note reviewed.  Constitutional:      General: She is not in acute distress.    Comments: Patient is slightly diaphoretic 35 year old female in no acute distress.  HENT:     Head: Normocephalic and atraumatic.     Nose: Nose normal.     Mouth/Throat:     Mouth: Mucous membranes are dry.  Eyes:     General: No scleral icterus. Cardiovascular:     Rate and Rhythm: Regular rhythm. Tachycardia present.     Pulses: Normal pulses.     Heart sounds: Normal heart sounds.  Pulmonary:     Effort: Pulmonary effort is normal. No  respiratory distress.     Breath sounds: Normal breath sounds. No wheezing.  Abdominal:     Palpations: Abdomen is soft.     Tenderness: There is no abdominal tenderness.  Musculoskeletal:     Cervical back: Normal range of motion.     Right lower leg: No edema.     Left lower leg: No edema.  Skin:    General: Skin is warm and dry.     Capillary Refill: Capillary refill takes less than 2 seconds.  Neurological:     Mental Status: She is alert. Mental status is at baseline.  Psychiatric:        Mood and Affect: Mood normal.        Behavior: Behavior normal.    ED Results / Procedures / Treatments   Labs (all labs ordered are listed, but only abnormal results are displayed) Labs Reviewed  RESP PANEL BY RT-PCR (FLU A&B, COVID) ARPGX2 - Abnormal; Notable for the following components:      Result Value   Influenza A by PCR POSITIVE (*)    All other components within normal limits  COMPREHENSIVE METABOLIC PANEL - Abnormal; Notable for the following components:   Sodium 133 (*)    Potassium 3.3 (*)    CO2 20 (*)    Calcium 8.3 (*)    Albumin 3.4 (*)    All other components within normal limits  LACTIC ACID, PLASMA  CBC WITH DIFFERENTIAL/PLATELET  PREGNANCY, URINE  URINALYSIS, ROUTINE W REFLEX MICROSCOPIC    EKG None  Radiology DG Chest 2 View  Result Date: 06/26/2021 CLINICAL DATA:  Cough. EXAM: CHEST - 2 VIEW COMPARISON:  None. FINDINGS: The heart size and mediastinal contours are within normal limits. Both lungs are clear. The visualized skeletal structures are unremarkable. IMPRESSION: No active cardiopulmonary disease. Electronically Signed   By: 06/28/2021 M.D.   On: 06/26/2021 18:51    Procedures Procedures   Medications Ordered in ED Medications  acetaminophen (TYLENOL) tablet 650 mg (650 mg Oral Given 06/26/21 1824)  lactated ringers bolus 1,000 mL (0 mLs Intravenous Stopped 06/26/21 2058)    ED Course  I have reviewed the triage vital signs and the  nursing notes.  Pertinent labs & imaging results that were available during my care of the patient were reviewed by me and considered in my medical decision making (see chart for details).  Clinical Course as  of 06/26/21 2233  Sat Jun 26, 2021  2050 Influenza A By PCR(!): POSITIVE CMP notable for mild hyponatremia hypokalemia likely from dehydration she has received 1 L LR.  Lactic within normal limits CBC unremarkable.  Influenza positive.  Awaiting urinalysis and urine pregnancy. [WF]  2050 Chest x-ray unremarkable. [WF]  2228 EKG nonischemic.  No significant ST T wave abnormalities of note. [WF]    Clinical Course User Index [WF] Gailen Shelter, [place]   MDM Rules/Calculators/A&P                          Patient is 35 year old female presented emergency room today with cough congestion fatigue myalgias tachycardic febrile and diaphoretic on initial examination.  Provided Tylenol 1 L LR and did feel significantly improved.  Lung examination is clear.  Abdomen soft nontender.  No urinary symptoms.  Urine pregnancy test negative influenza positive.  Negative for COVID.  CMP with evidence of dehydration with mild hyponatremia hypokalemia we will replete with 1 L LR.  Lactic within normal limits CBC unremarkable chest x-ray unremarkable EKG nonischemic as discussed above.  Will discharge home with conservative therapy Zofran for any nausea she experiences return precautions given and instructions to follow-up with PCP provided.  Also given information for Tylenol ibuprofen dosing and antitussives were prescribed.  Final Clinical Impression(s) / ED Diagnoses Final diagnoses:  Influenza A    Rx / DC Orders ED Discharge Orders          Ordered    ondansetron (ZOFRAN-ODT) 4 MG disintegrating tablet  Every 8 hours PRN        06/26/21 2232    promethazine-dextromethorphan (PROMETHAZINE-DM) 6.25-15 MG/5ML syrup  4 times daily PRN        06/26/21 2232    benzonatate (TESSALON) 100 MG  capsule  Every 8 hours        06/26/21 2232             Gailen Shelter, [place] 06/26/21 2234    Gloris Manchester, MD 06/27/21 1559

## 2021-06-26 NOTE — ED Triage Notes (Signed)
Pt c/o cough and cold symptoms for couple days now having some difficulty breathing.

## 2021-06-26 NOTE — ED Notes (Signed)
Discharge instructions discussed with pt. Pt verbalized understanding. Pt stable and ambulatory.  °

## 2021-07-18 ENCOUNTER — Encounter (HOSPITAL_COMMUNITY): Payer: Self-pay

## 2021-07-18 ENCOUNTER — Emergency Department (HOSPITAL_COMMUNITY)
Admission: EM | Admit: 2021-07-18 | Discharge: 2021-07-18 | Disposition: A | Discharge status: Home / Self Care | Payer: Self-pay | Attending: Emergency Medicine | Admitting: Emergency Medicine

## 2021-07-18 DIAGNOSIS — Z23 Encounter for immunization: Secondary | ICD-10-CM | POA: Insufficient documentation

## 2021-07-18 DIAGNOSIS — W540XXA Bitten by dog, initial encounter: Secondary | ICD-10-CM | POA: Insufficient documentation

## 2021-07-18 DIAGNOSIS — S71131A Puncture wound without foreign body, right thigh, initial encounter: Secondary | ICD-10-CM | POA: Insufficient documentation

## 2021-07-18 DIAGNOSIS — Y9289 Other specified places as the place of occurrence of the external cause: Secondary | ICD-10-CM | POA: Insufficient documentation

## 2021-07-18 MED ORDER — AMOXICILLIN-POT CLAVULANATE 875-125 MG PO TABS: 1.0000 | ORAL_TABLET | Freq: Two times a day (BID) | ORAL | 0 refills | Status: AC

## 2021-07-18 MED ORDER — AMOXICILLIN-POT CLAVULANATE 875-125 MG PO TABS
1.0000 | ORAL_TABLET | Freq: Once | ORAL | Status: AC
  Administered 2021-07-18: 1 via ORAL
  Filled 2021-07-18: qty 1

## 2021-07-18 MED ORDER — FLUCONAZOLE 200 MG PO TABS: ORAL_TABLET | ORAL | 1 refills | Status: AC

## 2021-07-18 MED ORDER — HYDROCODONE-ACETAMINOPHEN 5-325 MG PO TABS: 1.0000 | ORAL_TABLET | ORAL | 0 refills | Status: AC | PRN

## 2021-07-18 MED ORDER — TETANUS-DIPHTH-ACELL PERTUSSIS 5-2.5-18.5 LF-MCG/0.5 IM SUSY
0.5000 mL | PREFILLED_SYRINGE | Freq: Once | INTRAMUSCULAR | Status: AC
  Administered 2021-07-18: 0.5 mL via INTRAMUSCULAR
  Filled 2021-07-18: qty 0.5

## 2021-07-18 NOTE — ED Notes (Addendum)
Pt has a make shift tourniquet reminiscent of robe belt applied to right upper thigh. No active bleeding noted to anterior thigh lacerations and puncture wounds. Right leg is pink, tourniquet removed, no active bleeding noted from wounds. Wounds cleansed with sterile saline and sterile gauze. Lacerations irrigated with sterile saline. Abd applied to thigh wounds. Pt tolerated well. Pt reports that dogs that bit her were hers and provides vaccination records in ED. PO to beside.

## 2021-07-18 NOTE — ED Triage Notes (Signed)
Puncture wounds to right thigh, bitten by her dog

## 2021-07-18 NOTE — ED Notes (Signed)
Patient denies pain and is resting comfortably.  

## 2021-07-18 NOTE — ED Provider Notes (Signed)
[facility] EMERGENCY DEPARTMENT Provider Note   CSN: [id number] Arrival date & time: 07/18/21  1145     History  Chief Complaint  Patient presents with   Animal Bite    Patricia Fowler is a 36 y.o. female.   Animal Bite Associated symptoms: no numbness       Patricia Fowler is a 36 y.o. female who presents to the Emergency Department requesting evaluation of a dog bite to her right inner thigh.  Incident occurred at her home approximately 45 minutes to 1 hour prior to arrival.  States that her 2 dogs were fighting and she stepped into try to stop them and was accidentally bitten.  Dogs rabies vaccinations are up-to-date.  [facility] department has been contacted.  She complains some soreness and bruising of her inner thigh.  She temporarily applied a tourniquet to control bleeding.  No numbness or weakness of the extremity.  Last Td has been greater than 5 years ago  Home Medications Prior to Admission medications   Medication Sig Start Date End Date Taking? Authorizing Provider  fluconazole (DIFLUCAN) 150 MG tablet Take one tablet by mouth as a single dose. May repeat in 3 days if symptoms persist. 02/03/21   Mardella Layman, MD  nitrofurantoin, macrocrystal-monohydrate, (MACROBID) 100 MG capsule Take 1 capsule (100 mg total) by mouth 2 (two) times daily. 10/23/20   Wurst, [place], PA-C  phenazopyridine (PYRIDIUM) 200 MG tablet Take 1 tablet (200 mg total) by mouth 3 (three) times daily. 10/23/20   Wurst, [place], PA-C  valACYclovir (VALTREX) 1000 MG tablet Take 1 tablet (1,000 mg total) by mouth 3 (three) times daily. 02/03/21   Mardella Layman, MD      Allergies    Patient has no known allergies.    Review of Systems   Review of Systems  Musculoskeletal:  Positive for myalgias (Right thigh soreness).  Skin:  Positive for wound (2 dog bites to right inner thigh).  Neurological:  Negative for weakness and numbness.  All other systems reviewed and are  negative.  Physical Exam Updated Vital Signs BP (!) 136/99    Pulse 90    Temp 98 F (36.7 C) (Oral)    Resp 20    SpO2 99%  Physical Exam Vitals and nursing note reviewed.  Constitutional:      General: She is not in acute distress.    Appearance: Normal appearance.  Cardiovascular:     Rate and Rhythm: Normal rate and regular rhythm.     Pulses: Normal pulses.  Pulmonary:     Effort: Pulmonary effort is normal.  Musculoskeletal:        General: Tenderness (Localized tenderness to palpation of the mid right inner thigh.  No hematoma or swelling.) present. No swelling.  Skin:    General: Skin is warm.     Capillary Refill: Capillary refill takes less than 2 seconds.     Comments: Two bite marks to the right inner thigh.  Uppermost bite with 1 deep appearing puncture wound.  Multiple scratches noted.  Distal bite appear superficial.  No hematoma no active bleeding.  Neurological:     General: No focal deficit present.     Mental Status: She is alert.     Sensory: No sensory deficit.     Motor: No weakness.    ED Results / Procedures / Treatments   Labs (all labs ordered are listed, but only abnormal results are displayed) Labs Reviewed - No data to display  EKG None  Radiology No results found.  Procedures Procedures    Medications Ordered in ED Medications - No data to display  ED Course/ Medical Decision Making/ A&P                           Medical Decision Making  Patient here for evaluation of dog bite to the right inner thigh that occurred just shortly before ER arrival.  Patient was bitten by her own dog.  The dog is up-to-date on vaccinations.  [facility] department has been notified and report filed.  No indication for rabies vaccinations at this time.  On exam, she has 1 significant puncture wound remaining bites appears superficial.  No active bleeding or hematoma on exam.  Patient does not take blood thinners.  Neurovascular intact.  Plan,  wounds have been irrigated with saline.  Td updated here.  Patient will be started on Augmentin.  Wounds will be left open.  This was explained to patient she verbalized understanding.  Wounds bandaged.  She will follow-up with PCP for recheck.  Return precautions were discussed.  She appears appropriate for discharge home at this time.        Final Clinical Impression(s) / ED Diagnoses Final diagnoses:  Dog bite, initial encounter    Rx / DC Orders ED Discharge Orders     None         Rosey Bath 07/18/21 1417    Gloris Manchester, MD 07/19/21 2157

## 2021-07-18 NOTE — ED Notes (Signed)
Nonadhering dressing applied to right anterior thigh wounds.

## 2021-07-18 NOTE — Discharge Instructions (Signed)
Clean the area with mild soap and water.  Avoid alcohol or peroxide.  Keep the areas bandaged.  Take ibuprofen, 600 to 800 mg 3 times a day with food.  Take the antibiotic as directed until its finished.  Follow-up with your primary care for recheck.  Return to the emergency department for any new or worsening symptoms or signs of infection.

## 2021-10-06 ENCOUNTER — Emergency Department (HOSPITAL_BASED_OUTPATIENT_CLINIC_OR_DEPARTMENT_OTHER)
Admission: EM | Admit: 2021-10-06 | Discharge: 2021-10-06 | Disposition: A | Discharge status: Home / Self Care | Payer: Self-pay | Attending: Emergency Medicine | Admitting: Emergency Medicine

## 2021-10-06 ENCOUNTER — Other Ambulatory Visit: Payer: Self-pay

## 2021-10-06 ENCOUNTER — Encounter (HOSPITAL_BASED_OUTPATIENT_CLINIC_OR_DEPARTMENT_OTHER): Payer: Self-pay

## 2021-10-06 ENCOUNTER — Emergency Department (HOSPITAL_BASED_OUTPATIENT_CLINIC_OR_DEPARTMENT_OTHER): Payer: Self-pay

## 2021-10-06 DIAGNOSIS — R519 Headache, unspecified: Secondary | ICD-10-CM | POA: Insufficient documentation

## 2021-10-06 DIAGNOSIS — I10 Essential (primary) hypertension: Secondary | ICD-10-CM | POA: Insufficient documentation

## 2021-10-06 LAB — CBC WITH DIFFERENTIAL/PLATELET
Abs Immature Granulocytes: 0.01 10*3/uL (ref 0.00–0.07)
Basophils Absolute: 0.1 10*3/uL (ref 0.0–0.1)
Basophils Relative: 1 %
Eosinophils Absolute: 0.1 10*3/uL (ref 0.0–0.5)
Eosinophils Relative: 2 %
HCT: 40 % (ref 36.0–46.0)
Hemoglobin: 13.2 g/dL (ref 12.0–15.0)
Immature Granulocytes: 0 %
Lymphocytes Relative: 33 %
Lymphs Abs: 1.8 10*3/uL (ref 0.7–4.0)
MCH: 27.8 pg (ref 26.0–34.0)
MCHC: 33 g/dL (ref 30.0–36.0)
MCV: 84.4 fL (ref 80.0–100.0)
Monocytes Absolute: 0.3 10*3/uL (ref 0.1–1.0)
Monocytes Relative: 6 %
Neutro Abs: 3.1 10*3/uL (ref 1.7–7.7)
Neutrophils Relative %: 58 %
Platelets: 210 10*3/uL (ref 150–400)
RBC: 4.74 MIL/uL (ref 3.87–5.11)
RDW: 12.9 % (ref 11.5–15.5)
WBC: 5.3 10*3/uL (ref 4.0–10.5)
nRBC: 0 % (ref 0.0–0.2)

## 2021-10-06 LAB — BASIC METABOLIC PANEL
Anion gap: 8 (ref 5–15)
BUN: 12 mg/dL (ref 6–20)
CO2: 25 mmol/L (ref 22–32)
Calcium: 8.7 mg/dL — ABNORMAL LOW (ref 8.9–10.3)
Chloride: 104 mmol/L (ref 98–111)
Creatinine, Ser: 0.95 mg/dL (ref 0.44–1.00)
GFR, Estimated: >60 mL/min (ref >60)
Glucose, Bld: 99 mg/dL (ref 70–99)
Potassium: 3.7 mmol/L (ref 3.5–5.1)
Sodium: 137 mmol/L (ref 135–145)

## 2021-10-06 MED ORDER — HYDROCHLOROTHIAZIDE 25 MG PO TABS: 25.0000 mg | ORAL_TABLET | Freq: Every day | ORAL | 0 refills | Status: AC

## 2021-10-06 MED ORDER — DIPHENHYDRAMINE HCL 50 MG/ML IJ SOLN
12.5000 mg | Freq: Once | INTRAMUSCULAR | Status: AC
  Administered 2021-10-06: 12.5 mg via INTRAVENOUS
  Filled 2021-10-06: qty 1

## 2021-10-06 MED ORDER — METOCLOPRAMIDE HCL 5 MG/ML IJ SOLN
10.0000 mg | Freq: Once | INTRAMUSCULAR | Status: AC
  Administered 2021-10-06: 10 mg via INTRAVENOUS
  Filled 2021-10-06: qty 2

## 2021-10-06 MED ORDER — KETOROLAC TROMETHAMINE 30 MG/ML IJ SOLN
30.0000 mg | Freq: Once | INTRAMUSCULAR | Status: AC
Start: 2021-10-06 — End: 2021-10-06
  Administered 2021-10-06: 30 mg via INTRAVENOUS
  Filled 2021-10-06: qty 1

## 2021-10-06 NOTE — ED Triage Notes (Signed)
Pt c/o hypertension, hasnt taken antihypertensives in over a year. The past few days c/o headache and "face feels weird" and nausea today. ?

## 2021-10-06 NOTE — ED Notes (Signed)
Patient transported to CT 

## 2021-10-06 NOTE — ED Provider Notes (Signed)
?[facility] EMERGENCY DEPARTMENT ?Provider Note ? ? ?CSN: [id number] ?Arrival date & time: 10/06/21  1210 ? ?  ? ?History ?PMH: Past medical history of hypertension ?Chief Complaint  ?Patient presents with  ? Hypertension  ? ? ?Christina Figueroa is a 36 y.o. female.  Presents the emergency department the chief complaint of elevated blood pressure.  She states that about 2 days ago she noticed that she had cheek numbness.  This lasted about an hour and went away.  She says that she has a history of migraines and has been struggling with headaches recently as well.  Today she noticed her headache was got really bad and she checked her blood pressure.  She saw that her blood pressure was with a systolic in the 180s.  Given that she had the cheek numbness the other day and her blood pressure is high today she presented to the emergency department.  She used to be on blood pressure medications, but she just moved here and does not have a primary care, so she has not been taking any of them.  She denies any chest pain or shortness of breath. ? ?Hypertension ?Associated symptoms include headaches.  ? ?  ? ?Home Medications ?Prior to Admission medications   ?Medication Sig Start Date End Date Taking? Authorizing Provider  ?hydrochlorothiazide (HYDRODIURIL) 25 MG tablet Take 1 tablet (25 mg total) by mouth daily. 10/06/21 11/05/21 Yes Latrina Guttman, Finis BudGrace C, PA-C  ?benzonatate (TESSALON) 100 MG capsule Take 1 capsule (100 mg total) by mouth every 8 (eight) hours. 06/26/21   Gailen ShelterFondaw, Wylder S, PA  ?cyclobenzaprine (FLEXERIL) 10 MG tablet Take 1 tablet (10 mg total) by mouth 3 (three) times daily as needed for muscle spasms. 12/11/20   Molpus, John, MD  ?naproxen (NAPROSYN) 375 MG tablet Take 1 tablet twice daily as needed for back pain. 12/11/20   Molpus, John, MD  ?ondansetron (ZOFRAN-ODT) 4 MG disintegrating tablet Take 1 tablet (4 mg total) by mouth every 8 (eight) hours as needed for nausea or vomiting. 06/26/21   Gailen ShelterFondaw,  Wylder S, PA  ?promethazine-dextromethorphan (PROMETHAZINE-DM) 6.25-15 MG/5ML syrup Take 5 mLs by mouth 4 (four) times daily as needed for cough. 06/26/21   Gailen ShelterFondaw, Wylder S, PA  ?   ? ?Allergies    ?Patient has no known allergies.   ? ?Review of Systems   ?Review of Systems  ?Eyes:  Positive for photophobia.  ?Gastrointestinal:  Positive for nausea.  ?Neurological:  Positive for numbness and headaches.  ?All other systems reviewed and are negative. ? ?Physical Exam ?Updated Vital Signs ?BP (!) 149/103   Pulse 89   Temp 98.3 ?F (36.8 ?C) (Oral)   Resp 16   Ht 5\' 2"  (1.575 m)   Wt 93 kg   SpO2 100%   BMI 37.49 kg/m?  ?Physical Exam ?Vitals and nursing note reviewed.  ?Constitutional:   ?   General: She is not in acute distress. ?   Appearance: Normal appearance. She is not ill-appearing, toxic-appearing or diaphoretic.  ?HENT:  ?   Head: Normocephalic and atraumatic.  ?   Nose: No nasal deformity.  ?   Mouth/Throat:  ?   Lips: Pink. No lesions.  ?   Mouth: Mucous membranes are moist. No injury, lacerations, oral lesions or angioedema.  ?   Pharynx: Oropharynx is clear. Uvula midline. No pharyngeal swelling, oropharyngeal exudate, posterior oropharyngeal erythema or uvula swelling.  ?Eyes:  ?   General: Gaze aligned appropriately. No scleral icterus.    ?  Right eye: No discharge.     ?   Left eye: No discharge.  ?   Conjunctiva/sclera: Conjunctivae normal.  ?   Right eye: Right conjunctiva is not injected. No exudate or hemorrhage. ?   Left eye: Left conjunctiva is not injected. No exudate or hemorrhage. ?   Pupils: Pupils are equal, round, and reactive to light.  ?Cardiovascular:  ?   Rate and Rhythm: Normal rate and regular rhythm.  ?   Pulses: Normal pulses.     ?     Radial pulses are 2+ on the right side and 2+ on the left side.  ?     Dorsalis pedis pulses are 2+ on the right side and 2+ on the left side.  ?   Heart sounds: Normal heart sounds, S1 normal and S2 normal. Heart sounds not distant. No  murmur heard. ?  No friction rub. No gallop. No S3 or S4 sounds.  ?Pulmonary:  ?   Effort: Pulmonary effort is normal. No accessory muscle usage or respiratory distress.  ?   Breath sounds: Normal breath sounds. No stridor. No wheezing, rhonchi or rales.  ?Chest:  ?   Chest wall: No tenderness.  ?Abdominal:  ?   General: Abdomen is flat. Bowel sounds are normal. There is no distension.  ?   Palpations: Abdomen is soft. There is no mass or pulsatile mass.  ?   Tenderness: There is no abdominal tenderness. There is no guarding or rebound.  ?Musculoskeletal:  ?   Right lower leg: No edema.  ?   Left lower leg: No edema.  ?Skin: ?   General: Skin is warm and dry.  ?   Coloration: Skin is not jaundiced or pale.  ?   Findings: No bruising, erythema, lesion or rash.  ?Neurological:  ?   General: No focal deficit present.  ?   Mental Status: She is alert and oriented to person, place, and time.  ?   GCS: GCS eye subscore is 4. GCS verbal subscore is 5. GCS motor subscore is 6.  ?   Comments: Alert and Oriented x 3 ?Speech clear with no aphasia ?Cranial Nerve testing ?- PERRLA. EOM intact. No Nystagmus ?- Facial Sensation grossly intact ?- No facial asymmetry ?- Uvula and Tongue Midline ?- Accessory Muscles intact ?Motor: ?- 5/5 motor strength in all four extremities.  ?Sensation: ?- Grossly intact in all four extremities.  ?Coordination:  ?- Gait without abnormality. ?  ?Psychiatric:     ?   Mood and Affect: Mood normal.     ?   Behavior: Behavior normal. Behavior is cooperative.  ? ? ?ED Results / Procedures / Treatments   ?Labs ?(all labs ordered are listed, but only abnormal results are displayed) ?Labs Reviewed  ?BASIC METABOLIC PANEL - Abnormal; Notable for the following components:  ?    Result Value  ? Calcium 8.7 (*)   ? All other components within normal limits  ?CBC WITH DIFFERENTIAL/PLATELET  ? ? ?EKG ?None ? ?Radiology ?CT Head Wo Contrast ? ?Result Date: 10/06/2021 ?CLINICAL DATA:  Headache, chronic, new  features or increased frequency. EXAM: CT HEAD WITHOUT CONTRAST TECHNIQUE: Contiguous axial images were obtained from the base of the skull through the vertex without intravenous contrast. RADIATION DOSE REDUCTION: This exam was performed according to the departmental dose-optimization program which includes automated exposure control, adjustment of the mA and/or kV according to patient size and/or use of iterative reconstruction technique. COMPARISON:  Report from head CT  01/14/2003 (images unavailable). FINDINGS: Brain: Cerebral volume is normal. Partially empty sella turcica. There is no acute intracranial hemorrhage. No demarcated cortical infarct. No extra-axial fluid collection. No evidence of an intracranial mass. No midline shift. Vascular: No hyperdense vessel. Skull: Normal. Negative for fracture or focal lesion. Sinuses/Orbits: Visualized orbits show no acute finding. No significant paranasal sinus disease at the imaged levels. IMPRESSION: Partially empty sella turcica. While this finding often reflects incidental anatomic variation, it can also be associated with idiopathic intracranial hypertension (pseudotumor cerebri). Otherwise unremarkable non-contrast CT appearance of the brain. Electronically Signed   By: Jackey Loge D.O.   On: 10/06/2021 15:54   ? ?Procedures ?Procedures  ? ?Medications Ordered in ED ?Medications  ?ketorolac (TORADOL) 30 MG/ML injection 30 mg (30 mg Intravenous Given 10/06/21 1534)  ?metoCLOPramide (REGLAN) injection 10 mg (10 mg Intravenous Given 10/06/21 1534)  ?diphenhydrAMINE (BENADRYL) injection 12.5 mg (12.5 mg Intravenous Given 10/06/21 1532)  ? ? ?ED Course/ Medical Decision Making/ A&P ?  ?                        ?Medical Decision Making ?Amount and/or Complexity of Data Reviewed ?Labs: ordered. ?Radiology: ordered. ? ?Risk ?Prescription drug management. ? ? ? ?MDM  ?This is a 36 y.o. female who presents to the ED with elevated BP and headache ?The differential of this  patient includes but is not limited to htn urgency, hypertensive emergency ? ?My Impression, Plan, and ED Course:  ?Patient is well-appearing.  She has no acute distress.  Normal vitals. ?Headache is been present for 2 days.

## 2021-10-06 NOTE — Discharge Instructions (Addendum)
Please start taking BP medication that I have sent to your pharmacy. You should also schedule an appointment with [facility] health and wellness clinic to establish care.  ?I have also provided you a neurology referral for your chronic headaches.  ?

## 2024-05-13 ENCOUNTER — Encounter (HOSPITAL_BASED_OUTPATIENT_CLINIC_OR_DEPARTMENT_OTHER): Payer: Self-pay | Admitting: Urology

## 2024-05-13 ENCOUNTER — Other Ambulatory Visit: Payer: Self-pay

## 2024-05-13 ENCOUNTER — Emergency Department (HOSPITAL_BASED_OUTPATIENT_CLINIC_OR_DEPARTMENT_OTHER)

## 2024-05-13 ENCOUNTER — Emergency Department (HOSPITAL_BASED_OUTPATIENT_CLINIC_OR_DEPARTMENT_OTHER)
Admission: EM | Admit: 2024-05-13 | Discharge: 2024-05-13 | Disposition: A | Discharge status: Home / Self Care | Attending: Emergency Medicine | Admitting: Emergency Medicine

## 2024-05-13 DIAGNOSIS — I10 Essential (primary) hypertension: Secondary | ICD-10-CM | POA: Insufficient documentation

## 2024-05-13 DIAGNOSIS — W228XXA Striking against or struck by other objects, initial encounter: Secondary | ICD-10-CM | POA: Insufficient documentation

## 2024-05-13 DIAGNOSIS — R55 Syncope and collapse: Secondary | ICD-10-CM | POA: Insufficient documentation

## 2024-05-13 DIAGNOSIS — Z79899 Other long term (current) drug therapy: Secondary | ICD-10-CM | POA: Diagnosis not present

## 2024-05-13 DIAGNOSIS — S0990XA Unspecified injury of head, initial encounter: Secondary | ICD-10-CM | POA: Insufficient documentation

## 2024-05-13 LAB — URINALYSIS, ROUTINE W REFLEX MICROSCOPIC
Bilirubin Urine: NEGATIVE
Glucose, UA: NEGATIVE mg/dL
Ketones, ur: NEGATIVE mg/dL
Leukocytes,Ua: NEGATIVE
Nitrite: NEGATIVE
Protein, ur: NEGATIVE mg/dL
Specific Gravity, Urine: 1.01 (ref 1.005–1.030)
pH: 7 (ref 5.0–8.0)

## 2024-05-13 LAB — CBC
HCT: 38.4 % (ref 36.0–46.0)
Hemoglobin: 12.4 g/dL (ref 12.0–15.0)
MCH: 27.3 pg (ref 26.0–34.0)
MCHC: 32.3 g/dL (ref 30.0–36.0)
MCV: 84.4 fL (ref 80.0–100.0)
Platelets: 240 K/uL (ref 150–400)
RBC: 4.55 MIL/uL (ref 3.87–5.11)
RDW: 12.8 % (ref 11.5–15.5)
WBC: 6.1 K/uL (ref 4.0–10.5)
nRBC: 0 % (ref 0.0–0.2)

## 2024-05-13 LAB — COMPREHENSIVE METABOLIC PANEL WITH GFR
ALT: 13 U/L (ref 0–44)
AST: 20 U/L (ref 15–41)
Albumin: 4.2 g/dL (ref 3.5–5.0)
Alkaline Phosphatase: 91 U/L (ref 38–126)
Anion gap: 13 (ref 5–15)
BUN: 9 mg/dL (ref 6–20)
CO2: 23 mmol/L (ref 22–32)
Calcium: 9 mg/dL (ref 8.9–10.3)
Chloride: 102 mmol/L (ref 98–111)
Creatinine, Ser: 0.81 mg/dL (ref 0.44–1.00)
GFR, Estimated: >60 mL/min (ref >60)
Glucose, Bld: 116 mg/dL — ABNORMAL HIGH (ref 70–99)
Potassium: 3.4 mmol/L — ABNORMAL LOW (ref 3.5–5.1)
Sodium: 138 mmol/L (ref 135–145)
Total Bilirubin: 0.2 mg/dL (ref 0.0–1.2)
Total Protein: 7.5 g/dL (ref 6.5–8.1)

## 2024-05-13 LAB — URINALYSIS, MICROSCOPIC (REFLEX): WBC, UA: NONE SEEN WBC/hpf (ref 0–5)

## 2024-05-13 LAB — PREGNANCY, URINE: Preg Test, Ur: NEGATIVE

## 2024-05-13 MED ORDER — SODIUM CHLORIDE 0.9 % IV BOLUS
1000.0000 mL | Freq: Once | INTRAVENOUS | Status: AC
  Administered 2024-05-13: 1000 mL via INTRAVENOUS

## 2024-05-13 MED ORDER — KETOROLAC TROMETHAMINE 15 MG/ML IJ SOLN
15.0000 mg | Freq: Once | INTRAMUSCULAR | Status: AC
  Administered 2024-05-13: 15 mg via INTRAVENOUS
  Filled 2024-05-13: qty 1

## 2024-05-13 NOTE — Discharge Instructions (Signed)
 Stay hydrated and drink plenty of clear fluids over the next few days.  Follow-up with PCP in 1 week for any continued concerns.  Return to ED if any symptoms worsen including new syncopal episodes, uncontrollable headaches, uncontrollable nausea/vomiting, chest pain, shortness of breath.

## 2024-05-13 NOTE — ED Triage Notes (Signed)
 Pt ambulatory to triage  Pt states pcp told her to come in due to syncopal episode this am  States was on toilet using the bathroom when it happened  States slight headache and some blurry vision  States possibly hit head on floor with episode

## 2024-05-13 NOTE — ED Provider Notes (Signed)
 [facility] Provider Note   CSN: [id number] Arrival date & time: 05/13/24  1320     Patient presents with: Loss of Consciousness and Head Injury   Christina Figueroa is a 38 y.o. female.  Patient is a 38 year old female with a history of PCOS, hypertension, and migraines who presents to the ED for a syncopal episode that occurred last evening while using the restroom.  Patient notes she woke up in the middle of the night with some abdominal cramping.  She states she went to the restroom to movement.  She notes she was feeling nauseous as well as slightly dizzy the next thing she remembers, she was on the floor.  She does not remember passing out but does remember feeling lightheaded.  She notes she did have a normal bowel movement and does not believe she was straining during the bowel movement.  States she has had a headache today and some blurred vision but she is unsure if this is different from her normal headaches.  She called her PCP but was told to come to the ED for evaluation.  States she has never had syncopal episodes like this previously.  Has not taken any medications today.  Denies dizziness, nausea/vomiting, chest pain, shortness of breath, abdominal pain, urinary symptoms.  No further complaints.  Loss of Consciousness Associated symptoms: headaches   Associated symptoms: no chest pain, no dizziness, no fever, no nausea, no shortness of breath, no vomiting and no weakness   Head Injury Associated symptoms: headache   Associated symptoms: no nausea and no vomiting        Prior to Admission medications   Medication Sig Start Date End Date Taking? Authorizing Provider  amLODipine (NORVASC) 10 MG tablet Take 10 mg by mouth daily. 04/30/24  Yes [provider]  ibuprofen  (ADVIL ) 800 MG tablet Take 800 mg by mouth every 6 (six) hours as needed. 05/08/24  Yes [provider]  lisinopril (ZESTRIL) 20 MG tablet Take 20  mg by mouth daily. 04/30/24 04/30/25 Yes [provider]  rizatriptan (MAXALT) 10 MG tablet Take 10 mg by mouth as needed. Take 1 tablet (10 mg total) by mouth once as needed for migraine. May repeat dose once in 2 hours if no relief. Do not exceed 2 doses in 24 hours. 04/30/24  Yes [provider]  amoxicillin (AMOXIL) 500 MG capsule Take 500 mg by mouth every 8 (eight) hours.    [provider]  benzonatate  (TESSALON ) 100 MG capsule Take 1 capsule (100 mg total) by mouth every 8 (eight) hours. 06/26/21   Neldon Hamp RAMAN, PA  cyclobenzaprine  (FLEXERIL ) 10 MG tablet Take 1 tablet (10 mg total) by mouth 3 (three) times daily as needed for muscle spasms. 12/11/20   Molpus, John, MD  hydrochlorothiazide  (HYDRODIURIL ) 25 MG tablet Take 1 tablet (25 mg total) by mouth daily. 10/06/21 11/05/21  Loeffler, Ronnald BROCKS, PA-C  naproxen  (NAPROSYN ) 375 MG tablet Take 1 tablet twice daily as needed for back pain. 12/11/20   Molpus, Norleen, MD  ondansetron  (ZOFRAN -ODT) 4 MG disintegrating tablet Take 1 tablet (4 mg total) by mouth every 8 (eight) hours as needed for nausea or vomiting. 06/26/21   Neldon Hamp RAMAN, PA  promethazine -dextromethorphan (PROMETHAZINE -DM) 6.25-15 MG/5ML syrup Take 5 mLs by mouth 4 (four) times daily as needed for cough. 06/26/21   Neldon Hamp RAMAN, PA    Allergies: Patient has no known allergies.    Review of Systems  Constitutional:  Negative for chills and fever.  Eyes:  Negative for visual disturbance.  Respiratory:  Negative for shortness of breath.   Cardiovascular:  Positive for syncope. Negative for chest pain.  Gastrointestinal:  Negative for nausea and vomiting.  Neurological:  Positive for syncope and headaches. Negative for dizziness and weakness.  All other systems reviewed and are negative.   Updated Vital Signs BP (!) 145/75   Pulse 76   Temp 98.1 F (36.7 C) (Oral)   Resp 17   Ht 5' 2 (1.575 m)   Wt 93 kg   SpO2 99%   BMI 37.50 kg/m    Physical Exam Constitutional:      Appearance: Normal appearance.  HENT:     Head: Normocephalic and atraumatic.     Nose: Nose normal.     Mouth/Throat:     Mouth: Mucous membranes are moist.     Pharynx: Oropharynx is clear.  Eyes:     Extraocular Movements: Extraocular movements intact.     Pupils: Pupils are equal, round, and reactive to light.  Cardiovascular:     Rate and Rhythm: Normal rate.  Pulmonary:     Effort: Pulmonary effort is normal.  Abdominal:     General: Bowel sounds are normal.     Palpations: Abdomen is soft.     Tenderness: There is no abdominal tenderness.  Musculoskeletal:        General: Normal range of motion.  Skin:    General: Skin is warm and dry.  Neurological:     Mental Status: She is alert and oriented to person, place, and time. Mental status is at baseline.     Cranial Nerves: No cranial nerve deficit.  Psychiatric:        Mood and Affect: Mood normal.        Behavior: Behavior normal.     (all labs ordered are listed, but only abnormal results are displayed) Labs Reviewed  COMPREHENSIVE METABOLIC PANEL WITH GFR - Abnormal; Notable for the following components:      Result Value   Potassium 3.4 (*)    Glucose, Bld 116 (*)    All other components within normal limits  URINALYSIS, ROUTINE W REFLEX MICROSCOPIC - Abnormal; Notable for the following components:   Hgb urine dipstick SMALL (*)    All other components within normal limits  URINALYSIS, MICROSCOPIC (REFLEX) - Abnormal; Notable for the following components:   Bacteria, UA RARE (*)    All other components within normal limits  CBC  PREGNANCY, URINE    EKG: None  Radiology: CT HEAD WO CONTRAST Result Date: 05/13/2024 EXAM: CT HEAD WITHOUT CONTRAST 05/13/2024 01:52:04 PM TECHNIQUE: CT of the head was performed without the administration of intravenous contrast. Automated exposure control, iterative reconstruction, and/or weight based adjustment of the mA/kV was  utilized to reduce the radiation dose to as low as reasonably achievable. COMPARISON: CT head dated 05/27/2022. CLINICAL HISTORY: Head trauma, GCS=15, no focal neuro findings (low risk) (Ped 0-17y). FINDINGS: BRAIN AND VENTRICLES: No acute hemorrhage. No evidence of acute infarct. No hydrocephalus. Similar partially empty sella, nonspecific. No extra-axial collection. No mass effect or midline shift. ORBITS: No acute abnormality. SINUSES: No acute abnormality. SOFT TISSUES AND SKULL: No acute soft tissue abnormality. No skull fracture. IMPRESSION: 1. No acute intracranial hemorrhage or calvarial fracture. 2. Since 10/06/2021, again partially empty sella which is nonspecific but can be seen in the setting of idiopathic intracranial hypertension. Electronically signed by: prentice spade 05/13/2024 02:38 PM  EST RP Workstation: [id number]     Medications Ordered in the ED  ketorolac  (TORADOL ) 15 MG/ML injection 15 mg (15 mg Intravenous Given 05/13/24 1521)  sodium chloride  0.9 % bolus 1,000 mL (0 mLs Intravenous Stopped 05/13/24 1624)     Medical Decision Making Patient is a 38 year old female history of hypertension and PCOS who presents to the ED for a syncopal episode that occurred in the middle of the night last evening.  Please see detailed HPI above.  On exam patient is alert and oriented.  Physical exam as noted above.  Neurovascularly intact with no acute cranial nerve deficits.  Ambulatory with a normal gait.  Mild hypokalemia with a potassium of 3.4 but otherwise labs reassuring with no leukocytosis or abnormalities.  UA and urine pregnancy unremarkable.  EKG shows sinus rhythm.  CT of the head reviewed and negative for acute intracranial process.  Differential includes dehydration, vasovagal syncope secondary to post bowel movement, electrolyte abnormality, hypoglycemia, cardiac abnormality.  Suspect patient symptoms were secondary to bowel movement last evening.  Patient given a liter of fluids  and Toradol  while in the ED and states she is feeling much better.  She is otherwise well-appearing at this time with no deficits.  Stable for discharge home.  Symptomatic care discussed.  Advised PCP follow-up in 1 week for any continued symptoms.  Return precautions provided for worsening symptoms.  Amount and/or Complexity of Data Reviewed Labs: ordered. Radiology: ordered.  Risk Prescription drug management.    Final diagnoses:  Minor head injury, initial encounter  Syncope, unspecified syncope type    ED Discharge Orders     None          Neysa Thersia RAMAN, [place] 05/13/24 1809    Jerrol Agent, MD 05/13/24 [phone]

## 2024-05-13 NOTE — ED Notes (Signed)
 ED Provider at bedside.

## 8387-02-26 DEATH — deceased
